# Patient Record
Sex: Male | Born: 1965 | Race: Black or African American | Hispanic: No | Marital: Single | Smoking: Former smoker
Health system: Southern US, Community
[De-identification: ages and names within clinical notes are randomized; demographics above are authoritative.]

## PROBLEM LIST (undated history)

## (undated) ENCOUNTER — Ambulatory Visit (HOSPITAL_COMMUNITY): Admission: EM | Payer: Self-pay

## (undated) DIAGNOSIS — E785 Hyperlipidemia, unspecified: Secondary | ICD-10-CM

## (undated) DIAGNOSIS — I739 Peripheral vascular disease, unspecified: Secondary | ICD-10-CM

## (undated) DIAGNOSIS — I10 Essential (primary) hypertension: Secondary | ICD-10-CM

## (undated) DIAGNOSIS — M1712 Unilateral primary osteoarthritis, left knee: Secondary | ICD-10-CM

## (undated) HISTORY — DX: Unilateral primary osteoarthritis, left knee: M17.12

## (undated) HISTORY — DX: Essential (primary) hypertension: I10

## (undated) HISTORY — DX: Peripheral vascular disease, unspecified: I73.9

## (undated) HISTORY — DX: Hyperlipidemia, unspecified: E78.5

---

## 1999-07-10 ENCOUNTER — Encounter: Payer: Self-pay | Admitting: Emergency Medicine

## 1999-07-10 ENCOUNTER — Emergency Department (HOSPITAL_COMMUNITY): Admission: EM | Admit: 1999-07-10 | Discharge: 1999-07-10 | Payer: Self-pay | Admitting: Emergency Medicine

## 2000-07-05 ENCOUNTER — Emergency Department (HOSPITAL_COMMUNITY): Admission: EM | Admit: 2000-07-05 | Discharge: 2000-07-05 | Payer: Self-pay | Admitting: Internal Medicine

## 2000-12-26 ENCOUNTER — Emergency Department (HOSPITAL_COMMUNITY): Admission: EM | Admit: 2000-12-26 | Discharge: 2000-12-26 | Payer: Self-pay | Admitting: Emergency Medicine

## 2002-06-12 ENCOUNTER — Emergency Department (HOSPITAL_COMMUNITY): Admission: EM | Admit: 2002-06-12 | Discharge: 2002-06-12 | Payer: Self-pay | Admitting: Emergency Medicine

## 2003-01-24 ENCOUNTER — Emergency Department (HOSPITAL_COMMUNITY): Admission: EM | Admit: 2003-01-24 | Discharge: 2003-01-24 | Payer: Self-pay | Admitting: Emergency Medicine

## 2003-02-06 ENCOUNTER — Emergency Department (HOSPITAL_COMMUNITY): Admission: EM | Admit: 2003-02-06 | Discharge: 2003-02-06 | Payer: Self-pay | Admitting: Emergency Medicine

## 2003-02-25 ENCOUNTER — Emergency Department (HOSPITAL_COMMUNITY): Admission: EM | Admit: 2003-02-25 | Discharge: 2003-02-25 | Payer: Self-pay | Admitting: Emergency Medicine

## 2007-10-19 ENCOUNTER — Emergency Department (HOSPITAL_COMMUNITY): Admission: EM | Admit: 2007-10-19 | Discharge: 2007-10-19 | Payer: Self-pay | Admitting: Emergency Medicine

## 2008-11-02 ENCOUNTER — Emergency Department (HOSPITAL_COMMUNITY): Admission: EM | Admit: 2008-11-02 | Discharge: 2008-11-02 | Payer: Self-pay | Admitting: Emergency Medicine

## 2009-06-23 ENCOUNTER — Emergency Department (HOSPITAL_COMMUNITY): Admission: EM | Admit: 2009-06-23 | Discharge: 2009-06-23 | Payer: Self-pay | Admitting: Emergency Medicine

## 2009-06-25 ENCOUNTER — Emergency Department (HOSPITAL_COMMUNITY): Admission: EM | Admit: 2009-06-25 | Discharge: 2009-06-26 | Payer: Self-pay | Admitting: Emergency Medicine

## 2009-08-10 ENCOUNTER — Emergency Department (HOSPITAL_COMMUNITY): Admission: EM | Admit: 2009-08-10 | Discharge: 2009-08-10 | Payer: Self-pay | Admitting: Emergency Medicine

## 2009-10-28 ENCOUNTER — Emergency Department (HOSPITAL_COMMUNITY): Admission: EM | Admit: 2009-10-28 | Discharge: 2009-10-28 | Payer: Self-pay | Admitting: Emergency Medicine

## 2010-12-05 LAB — DIFFERENTIAL
Basophils Absolute: 0 10*3/uL (ref 0.0–0.1)
Basophils Relative: 0 % (ref 0–1)
Basophils Relative: 0 % (ref 0–1)
Eosinophils Absolute: 0.1 10*3/uL (ref 0.0–0.7)
Eosinophils Absolute: 0.1 10*3/uL (ref 0.0–0.7)
Eosinophils Relative: 2 % (ref 0–5)
Lymphocytes Relative: 23 % (ref 12–46)
Lymphs Abs: 1.3 10*3/uL (ref 0.7–4.0)
Lymphs Abs: 1.9 10*3/uL (ref 0.7–4.0)
Monocytes Absolute: 0.4 10*3/uL (ref 0.1–1.0)
Monocytes Absolute: 0.6 10*3/uL (ref 0.1–1.0)
Monocytes Relative: 8 % (ref 3–12)
Monocytes Relative: 8 % (ref 3–12)
Neutro Abs: 3.6 10*3/uL (ref 1.7–7.7)
Neutrophils Relative %: 65 % (ref 43–77)
Neutrophils Relative %: 66 % (ref 43–77)

## 2010-12-05 LAB — CBC
HCT: 41.4 % (ref 39.0–52.0)
Hemoglobin: 13.7 g/dL (ref 13.0–17.0)
Hemoglobin: 13.8 g/dL (ref 13.0–17.0)
MCHC: 33.4 g/dL (ref 30.0–36.0)
MCHC: 33.5 g/dL (ref 30.0–36.0)
MCV: 85 fL (ref 78.0–100.0)
MCV: 85 fL (ref 78.0–100.0)
Platelets: 207 10*3/uL (ref 150–400)
RBC: 4.8 MIL/uL (ref 4.22–5.81)
RBC: 4.87 MIL/uL (ref 4.22–5.81)
RDW: 14.1 % (ref 11.5–15.5)
WBC: 5.4 10*3/uL (ref 4.0–10.5)
WBC: 7.6 10*3/uL (ref 4.0–10.5)

## 2010-12-05 LAB — POCT I-STAT, CHEM 8
BUN: 22 mg/dL (ref 6–23)
Chloride: 108 mEq/L (ref 96–112)
Creatinine, Ser: 0.9 mg/dL (ref 0.4–1.5)
Potassium: 4.4 mEq/L (ref 3.5–5.1)
Sodium: 142 mEq/L (ref 135–145)
TCO2: 25 mmol/L (ref 0–100)

## 2012-01-04 ENCOUNTER — Ambulatory Visit (INDEPENDENT_AMBULATORY_CARE_PROVIDER_SITE_OTHER): Payer: BC Managed Care – PPO | Admitting: Physician Assistant

## 2012-01-04 VITALS — BP 137/86 | HR 85 | Temp 98.4°F | Resp 18 | Ht 69.0 in | Wt 358.0 lb

## 2012-01-04 DIAGNOSIS — Z1211 Encounter for screening for malignant neoplasm of colon: Secondary | ICD-10-CM

## 2012-01-04 DIAGNOSIS — Z0000 Encounter for general adult medical examination without abnormal findings: Secondary | ICD-10-CM

## 2012-01-04 DIAGNOSIS — R5383 Other fatigue: Secondary | ICD-10-CM

## 2012-01-04 DIAGNOSIS — M25569 Pain in unspecified knee: Secondary | ICD-10-CM

## 2012-01-04 DIAGNOSIS — Z125 Encounter for screening for malignant neoplasm of prostate: Secondary | ICD-10-CM

## 2012-01-04 LAB — POCT URINALYSIS DIPSTICK
Glucose, UA: NEGATIVE
Leukocytes, UA: NEGATIVE
Nitrite, UA: NEGATIVE
Protein, UA: NEGATIVE
Urobilinogen, UA: 0.2

## 2012-01-04 LAB — POCT CBC
Lymph, poc: 2.5 (ref 0.6–3.4)
MCH, POC: 27.4 pg (ref 27–31.2)
MCHC: 31.6 g/dL — AB (ref 31.8–35.4)
MID (cbc): 0.3 (ref 0–0.9)
MPV: 9.7 fL (ref 0–99.8)
POC Granulocyte: 2.8 (ref 2–6.9)
POC MID %: 6 %M (ref 0–12)
Platelet Count, POC: 276 10*3/uL (ref 142–424)
RBC: 5 M/uL (ref 4.69–6.13)
WBC: 5.6 10*3/uL (ref 4.6–10.2)

## 2012-01-04 LAB — COMPREHENSIVE METABOLIC PANEL
ALT: 29 U/L (ref 0–53)
AST: 18 U/L (ref 0–37)
Alkaline Phosphatase: 88 U/L (ref 39–117)
Glucose, Bld: 99 mg/dL (ref 70–99)
Potassium: 4.3 mEq/L (ref 3.5–5.3)
Sodium: 138 mEq/L (ref 135–145)
Total Bilirubin: 0.4 mg/dL (ref 0.3–1.2)
Total Protein: 7.5 g/dL (ref 6.0–8.3)

## 2012-01-04 LAB — TESTOSTERONE: Testosterone: 347.27 ng/dL (ref 300–890)

## 2012-01-04 LAB — POCT UA - MICROSCOPIC ONLY
Casts, Ur, LPF, POC: NEGATIVE
Crystals, Ur, HPF, POC: NEGATIVE
Yeast, UA: NEGATIVE

## 2012-01-04 LAB — LIPID PANEL
LDL Cholesterol: 164 mg/dL — ABNORMAL HIGH (ref 0–99)
Triglycerides: 99 mg/dL (ref <150)
VLDL: 20 mg/dL (ref 0–40)

## 2012-01-04 LAB — TSH: TSH: 0.458 u[IU]/mL (ref 0.350–4.500)

## 2012-01-04 LAB — GLUCOSE, POCT (MANUAL RESULT ENTRY): POC Glucose: 99

## 2012-01-04 NOTE — Patient Instructions (Signed)

## 2012-01-04 NOTE — Progress Notes (Signed)
Subjective:    Patient ID: Mitchell Johnson, male    DOB: June 29, 1966, 46 y.o.   MRN: [id number]  HPI  Presents for DOT exam, needs CPE.    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  Since I saw him last, he has remarried (his first wife died in 2005-02-03).  He has cut back on his cigars, dwon from 6/day to 2.  Has lost about 10 lbs., with a goal weight of 250 lbs.  Tetanus is current.  Review of Systems  Constitutional: Positive for fatigue.  HENT: Negative.   Eyes: Negative.   Respiratory: Negative.   Cardiovascular: Negative.   Gastrointestinal: Negative.   Genitourinary: Negative.        Decrease sexual indurence.  Musculoskeletal: Positive for arthralgias (right lateral knee pain).  Skin: Negative.   Neurological: Negative.   Hematological: Negative.   Psychiatric/Behavioral: Negative.        Objective:   Physical Exam  Vitals reviewed. Constitutional: He is oriented to person, place, and time. Vital signs are normal. He appears well-developed and well-nourished.  Non-toxic appearance. He does not have a sickly appearance. He does not appear ill. No distress.  HENT:  Head: Normocephalic and atraumatic. No trismus in the jaw.  Right Ear: Hearing, tympanic membrane, external ear and ear canal normal.  Left Ear: Hearing, tympanic membrane, external ear and ear canal normal.  Nose: Nose normal.  Mouth/Throat: Uvula is midline, oropharynx is clear and moist and mucous membranes are normal. He does not have dentures. No oral lesions. Normal dentition. No dental abscesses, uvula swelling, lacerations or dental caries.  Eyes: Conjunctivae and EOM are normal. Pupils are equal, round, and reactive to light. Right eye exhibits no discharge. Left eye exhibits no discharge. No scleral icterus.  Fundoscopic exam:      The right eye shows no arteriolar narrowing, no AV nicking, no exudate, no hemorrhage and no papilledema. The right eye shows  red reflex.The right eye shows no venous pulsations.      The left eye shows no arteriolar narrowing, no AV nicking, no exudate, no hemorrhage and no papilledema. The left eye shows red reflex.The left eye shows no venous pulsations. Neck: Normal range of motion, full passive range of motion without pain and phonation normal. Neck supple. No spinous process tenderness and no muscular tenderness present. No rigidity. No tracheal deviation, no edema, no erythema and normal range of motion present. No thyromegaly present.  Cardiovascular: Normal rate, regular rhythm, S1 normal, S2 normal, normal heart sounds, intact distal pulses and normal pulses.  Exam reveals no gallop and no friction rub.   No murmur heard. Pulmonary/Chest: Effort normal and breath sounds normal. No respiratory distress. He has no wheezes. He has no rales.  Abdominal: Soft. Normal appearance and bowel sounds are normal. He exhibits no distension and no mass. There is no hepatosplenomegaly. There is no tenderness. There is no rebound and no guarding. No hernia. Hernia confirmed negative in the right inguinal area and confirmed negative in the left inguinal area.  Genitourinary: Rectum normal, prostate normal, testes normal and penis normal. Guaiac negative stool. No phimosis, paraphimosis, hypospadias, penile erythema or penile tenderness. No discharge found.  Musculoskeletal: Normal range of motion. He exhibits no edema and no tenderness.       Right shoulder: Normal.       Left shoulder: Normal.       Right elbow: Normal.  Left elbow: Normal.       Right wrist: Normal.       Left wrist: Normal.       Right hip: Normal.       Left hip: Normal.       Right knee: Normal.       Left knee: Normal.       Right ankle: Normal. Achilles tendon normal.       Left ankle: Normal. Achilles tendon normal.       Cervical back: Normal. He exhibits normal range of motion, no tenderness, no bony tenderness, no swelling, no edema, no  deformity, no laceration, no pain, no spasm and normal pulse.       Thoracic back: Normal.       Lumbar back: Normal.       Right upper arm: Normal.       Left upper arm: Normal.       Right forearm: Normal.       Left forearm: Normal.       Right hand: Normal.       Left hand: Normal.       Right upper leg: Normal.       Left upper leg: Normal.       Right lower leg: Normal.       Left lower leg: Normal.       Right foot: Normal.       Left foot: Normal.  Lymphadenopathy:       Head (right side): No submental, no submandibular, no tonsillar, no preauricular, no posterior auricular and no occipital adenopathy present.       Head (left side): No submental, no submandibular, no tonsillar, no preauricular, no posterior auricular and no occipital adenopathy present.    He has no cervical adenopathy.       Right: No inguinal and no supraclavicular adenopathy present.       Left: No inguinal and no supraclavicular adenopathy present.  Neurological: He is alert and oriented to person, place, and time. He has normal strength and normal reflexes. He displays no tremor. No cranial nerve deficit. He exhibits normal muscle tone. Coordination and gait normal. GCS eye subscore is 4. GCS verbal subscore is 5. GCS motor subscore is 6.  Skin: Skin is warm, dry and intact. No abrasion, no ecchymosis, no laceration, no lesion and no rash noted. He is not diaphoretic. No cyanosis or erythema. No pallor. Nails show no clubbing.  Psychiatric: He has a normal mood and affect. His speech is normal and behavior is normal. Judgment and thought content normal. Cognition and memory are normal.   Results for orders placed in visit on 01/04/12  IFOBT (OCCULT BLOOD)      Component Value Range   IFOBT Negative    POCT CBC      Component Value Range   WBC 5.6  4.6 - 10.2 (K/uL)   Lymph, poc 2.5  0.6 - 3.4    POC LYMPH PERCENT 44.6  10 - 50 (%L)   MID (cbc) 0.3  0 - 0.9    POC MID % 6.0  0 - 12 (%M)   POC  Granulocyte 2.8  2 - 6.9    Granulocyte percent 49.4  37 - 80 (%G)   RBC 5.00  4.69 - 6.13 (M/uL)   Hemoglobin 13.7 (*) 14.1 - 18.1 (g/dL)   HCT, POC 47.8 (*) 29.5 - 53.7 (%)   MCV 86.9  80 - 97 (fL)   MCH,  POC 27.4  27 - 31.2 (pg)   MCHC 31.6 (*) 31.8 - 35.4 (g/dL)   RDW, POC 45.4     Platelet Count, POC 276  142 - 424 (K/uL)   MPV 9.7  0 - 99.8 (fL)  GLUCOSE, POCT (MANUAL RESULT ENTRY)      Component Value Range   POC Glucose 99    POCT UA - MICROSCOPIC ONLY      Component Value Range   WBC, Ur, HPF, POC 0-1     RBC, urine, microscopic 0-2     Bacteria, U Microscopic trace     Mucus, UA neg     Epithelial cells, urine per micros 0-2     Crystals, Ur, HPF, POC neg     Casts, Ur, LPF, POC neg     Yeast, UA neg    POCT URINALYSIS DIPSTICK      Component Value Range   Color, UA yellow     Clarity, UA clear     Glucose, UA neg     Bilirubin, UA neg     Ketones, UA neg     Spec Grav, UA 1.025     Blood, UA trace     pH, UA 5.5     Protein, UA neg     Urobilinogen, UA 0.2     Nitrite, UA neg     Leukocytes, UA Negative        Assessment & Plan:   1. Routine general medical examination at a health care facility  POCT glucose (manual entry), POCT UA - Microscopic Only, POCT urinalysis dipstick, Comprehensive metabolic panel, Lipid panel  2. Morbid obesity  TSH, Testosterone  3. Fatigue  POCT CBC, Testosterone  4. Knee pain  NSAIDS prn  5. Screening for colon cancer  IFOBT POC (occult bld, rslt in office)  6. 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 Continue efforts for weight loss! Re-evaluate in 1 year.

## 2012-01-05 ENCOUNTER — Encounter: Payer: Self-pay | Admitting: Physician Assistant

## 2012-03-03 ENCOUNTER — Telehealth: Payer: Self-pay

## 2012-03-03 NOTE — Telephone Encounter (Signed)
Faxed over the requested information from message below to the patient at the fax number provided.

## 2012-03-03 NOTE — Telephone Encounter (Signed)
Pt needs his Long form to be faxed to [phone] Also, needs a copy for personal use. His license has been suppended - PLEASE SEND NOW.

## 2014-01-08 ENCOUNTER — Ambulatory Visit: Payer: Self-pay | Admitting: Family Medicine

## 2014-01-08 VITALS — BP 132/82 | HR 92 | Temp 98.0°F | Resp 18 | Ht 68.0 in | Wt 385.2 lb

## 2014-01-08 DIAGNOSIS — Z0289 Encounter for other administrative examinations: Secondary | ICD-10-CM

## 2014-01-08 NOTE — Progress Notes (Signed)
Commercial Driver Medical Examination   Mitchell Johnson L Crossen is a 48 y.o. male who presents today for a commercial driver fitness determination physical exam. The patient reports no problems.  Has been seen here by Tinnie GensJeffrey, PA prev for prior DOTs and occ full CPE. The following portions of the patient's history were reviewed and updated as appropriate: allergies, current medications, past family history, past medical history, past social history, past surgical history and problem list. Review of Systems A comprehensive review of systems was negative.   Objective:    Vision:  Uncorrected Corrected Horizontal Field of Vision  Right Eye 20/25 na 85 degrees  Left Eye  20/25 na 85 degrees  Both Eyes  20/20 na    Applicant can recognize and distinguish among traffic control signals and devices showing standard red, green, and amber colors.     Monocular Vision?: No   Hearing:   500 Hz 1000 Hz 2000 Hz 4000 Hz  Right Ear  na na na na  Left Ear  na na na na  Can hear whisper at 10 feet bilaterally.    BP 132/82  Pulse 92  Temp(Src) 98 F (36.7 C) (Oral)  Resp 18  Ht 5\' 8"  (1.727 m)  Wt 385 lb 3.2 oz (174.726 kg)  BMI 58.58 kg/m2  SpO2 100%  General Appearance:    Alert, cooperative, no distress, appears stated age  Head:    Normocephalic, without obvious abnormality, atraumatic  Eyes:    PERRL, conjunctiva/corneas clear, EOM's intact, fundi    benign, both eyes       Ears:    Normal TM's and external ear canals, both ears  Nose:   Nares normal, septum midline, mucosa normal, no drainage    or sinus tenderness  Throat:   Lips, mucosa, and tongue normal; teeth and gums normal  Neck:   Supple, symmetrical, trachea midline, no adenopathy;       thyroid:  No enlargement/tenderness/nodules; no carotid   bruit or JVD  Back:     Symmetric, no curvature, ROM normal, no CVA tenderness  Lungs:     Clear to auscultation bilaterally, respirations unlabored  Chest wall:    No tenderness or  deformity  Heart:    Regular rate and rhythm, S1 and S2 normal, no murmur, rub   or gallop  Abdomen:     Soft, non-tender, bowel sounds active all four quadrants,    no masses, no organomegaly  Genitalia:    Normal male without lesion, discharge or tenderness     Extremities:   Extremities normal, atraumatic, no cyanosis or edema  Pulses:   2+ and symmetric all extremities  Skin:   Skin color, texture, turgor normal, no rashes or lesions  Lymph nodes:   Cervical, supraclavicular, and axillary nodes normal  Neurologic:   CNII-XII intact. Normal strength, sensation and reflexes      throughout    Labs: Lab Results  Component Value Date   SPECGRAV 1.025 01/04/2012   PROTEINUR neg 01/04/2012   BILIRUBINUR neg 01/04/2012   SG 1.020, neg prot, trc blood, neg sugar    Assessment:    Healthy male exam.  Meets standards in 6149 CFR 391.41;  qualifies for 2 year certificate.    Plan:    Medical examiners certificate completed and printed. Return as needed.

## 2014-01-08 NOTE — Progress Notes (Signed)
   Subjective:    Patient ID: Mitchell Johnson, male    DOB: 10/27/1965, 48 y.o.   MRN: [id number] This chart was scribed for Levell Julyva N. Clelia CroftShaw, MD by Marica OtterNusrat Rahman, ED Scribe. This patient was seen in room 1 and the patient's care was started at 10:15 AM.    HPI  HPI Comments: Mitchell Johnson is a 48 y.o. male who presents to the [facility] accompanied by his wife complaining of elevated blood pressure. Pt states he has not taken meds for BP prior. Pt further reports that he has been watching his diet and exercising (including dancing) to lose weight. Pt reports he currently does not have health insurance but plans to rectify this situation soon.   PCP: Tinnie GensJeffrey   Past Medical History  Diagnosis Date  . Hyperlipidemia    No current outpatient prescriptions on file prior to visit.   No current facility-administered medications on file prior to visit.   No Known Allergies   Review of Systems  Constitutional: Negative for activity change, appetite change and unexpected weight change.  Respiratory: Negative for apnea, chest tightness, shortness of breath and wheezing.   Cardiovascular: Negative for chest pain, palpitations and leg swelling.  Neurological: Negative for syncope and weakness.    Objective:   Physical Exam  Nursing note and vitals reviewed. Constitutional: He is oriented to person, place, and time. He appears well-developed and well-nourished. No distress.  HENT:  Head: Normocephalic and atraumatic.  Eyes: EOM are normal.  Neck: Neck supple. No tracheal deviation present.  Cardiovascular: Normal rate.   Exam limited due to body habitus.   Pulmonary/Chest: Effort normal. No respiratory distress.  Musculoskeletal: Normal range of motion.  Neurological: He is alert and oriented to person, place, and time.  Skin: Skin is warm and dry.  Psychiatric: He has a normal mood and affect. His behavior is normal.   BP 132/82  Pulse 92  Temp(Src) 98 F (36.7 C)  (Oral)  Resp 18  Ht 5\' 8"  (1.727 m)  Wt 385 lb 3.2 oz (174.726 kg)  BMI 58.58 kg/m2  SpO2 100%   Assessment & Plan:  10:25 AM- No diagnosis found.  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  Initially pt's BP was quite elevated so discussed possibility of anti-HTN meds however BP improved when checked w/ thigh cuff. Pt does not currently have health ins but plans to obtain then will RTC for full CPE inc bp check, lipids, DM screen. Reviewed diet/exercise/weight loss w/ pt.   I personally performed the services described in this documentation, which was scribed in my presence. The recorded information has been reviewed and considered, and addended by me as needed.  Norberto SorensonEva Yeshua Stryker, MD MPH

## 2014-01-08 NOTE — Progress Notes (Deleted)
° °  Subjective:    Patient ID: Mitchell Johnson, male    DOB: 10/28/1965, 48 y.o.   MRN: [id number] This chart was scribed for Levell Julyva N. Clelia CroftShaw, MD by Marica OtterNusrat Rahman, ED Scribe. This patient was seen in room 1 and the patient's care was started at 10:15 AM.    HPI  HPI Comments: Mitchell Johnson is a 48 y.o. male who presents to the [facility] accompanied by his wifecomplaining of elevated blood pressure. Pt states he has not taken meds for his HTN prior. Pt further reports that he has been watching his diet and exercising (including dancing) to lose weight. Pt reports he currently does not have health insurance but plans to rectify this situation soon. Pt denies chest pain, SOB, palpitations, orthopnea, or changes in edema.   PCP: Tinnie GensJeffrey   Review of Systems  Respiratory: Negative for shortness of breath.   Cardiovascular: Negative for palpitations.    Objective:   Physical Exam  Nursing note and vitals reviewed. Constitutional: He is oriented to person, place, and time. He appears well-developed and well-nourished. No distress.  HENT:  Head: Normocephalic and atraumatic.  Eyes: EOM are normal.  Neck: Neck supple. No tracheal deviation present.  Cardiovascular: Normal rate.   Exam limited due to body habitus.   Pulmonary/Chest: Effort normal. No respiratory distress.  Musculoskeletal: Normal range of motion.  Neurological: He is alert and oriented to person, place, and time.  Skin: Skin is warm and dry.  Psychiatric: He has a normal mood and affect. His behavior is normal.   BP 132/82   Pulse 92   Temp(Src) 98 F (36.7 C) (Oral)   Resp 18   Ht 5\' 8"  (1.727 m)   Wt 385 lb 3.2 oz (174.726 kg)   BMI 58.58 kg/m2   SpO2 100%   Assessment & Plan:  10:25 AM-Discussed treatment plan which includes starting anti HTN meds today and lab work with pt at bedside. Pt declines lab work due to expense, however, will return to the clinic when he has insurance for lab work including  testing for DM. Pt agrees to remainder of plan.  No diagnosis found.  No orders of the defined types were placed in this encounter.    I personally performed the services described in this documentation, which was scribed in my presence. The recorded information has been reviewed and considered, and addended by me as needed.  Norberto SorensonEva Shaw, MD MPH

## 2014-01-21 ENCOUNTER — Encounter (HOSPITAL_COMMUNITY): Payer: Self-pay | Admitting: Emergency Medicine

## 2014-01-21 ENCOUNTER — Emergency Department (HOSPITAL_COMMUNITY)
Admission: EM | Admit: 2014-01-21 | Discharge: 2014-01-21 | Disposition: A | Discharge status: Home / Self Care | Payer: Self-pay | Attending: Emergency Medicine | Admitting: Emergency Medicine

## 2014-01-21 ENCOUNTER — Emergency Department (HOSPITAL_COMMUNITY): Payer: Self-pay

## 2014-01-21 DIAGNOSIS — X500XXA Overexertion from strenuous movement or load, initial encounter: Secondary | ICD-10-CM | POA: Insufficient documentation

## 2014-01-21 DIAGNOSIS — Y9301 Activity, walking, marching and hiking: Secondary | ICD-10-CM | POA: Insufficient documentation

## 2014-01-21 DIAGNOSIS — F17200 Nicotine dependence, unspecified, uncomplicated: Secondary | ICD-10-CM | POA: Insufficient documentation

## 2014-01-21 DIAGNOSIS — IMO0002 Reserved for concepts with insufficient information to code with codable children: Secondary | ICD-10-CM | POA: Insufficient documentation

## 2014-01-21 DIAGNOSIS — Z862 Personal history of diseases of the blood and blood-forming organs and certain disorders involving the immune mechanism: Secondary | ICD-10-CM | POA: Insufficient documentation

## 2014-01-21 DIAGNOSIS — Y929 Unspecified place or not applicable: Secondary | ICD-10-CM | POA: Insufficient documentation

## 2014-01-21 DIAGNOSIS — Z8639 Personal history of other endocrine, nutritional and metabolic disease: Secondary | ICD-10-CM | POA: Insufficient documentation

## 2014-01-21 DIAGNOSIS — S8390XA Sprain of unspecified site of unspecified knee, initial encounter: Secondary | ICD-10-CM

## 2014-01-21 MED ORDER — NAPROXEN 500 MG PO TABS: 500.0000 mg | ORAL_TABLET | Freq: Two times a day (BID) | ORAL | Status: DC

## 2014-01-21 MED ORDER — OXYCODONE-ACETAMINOPHEN 5-325 MG PO TABS
1.0000 | ORAL_TABLET | Freq: Once | ORAL | Status: AC
  Administered 2014-01-21: 1 via ORAL
  Filled 2014-01-21: qty 1

## 2014-01-21 MED ORDER — OXYCODONE-ACETAMINOPHEN 5-325 MG PO TABS
1.0000 | ORAL_TABLET | Freq: Four times a day (QID) | ORAL | Status: DC | PRN
Start: 2014-01-21 — End: 2014-12-30

## 2014-01-21 NOTE — Discharge Instructions (Signed)
Follow up with Dr. Ophelia Charter this week for recheck

## 2014-01-21 NOTE — ED Provider Notes (Signed)
CSN: [id number]     Arrival date & time 01/21/14  2034 History   First MD Initiated Contact with Patient 01/21/14 2129     Chief Complaint  Patient presents with  . Knee Injury     (Consider location/radiation/quality/duration/timing/severity/associated sxs/prior Treatment) Patient is a 48 y.o. male presenting with knee pain. The history is provided by the patient (the pt twisted his knee today and has pain in the right knee medially).  Knee Pain Lower extremity pain location: medial right knee. Injury: yes   Mechanism of injury comment:  Twisted Pain details:    Quality:  Aching   Radiates to:  Does not radiate   Severity:  Moderate   Onset quality:  Gradual   Timing:  Constant   Progression:  Unchanged Associated symptoms: no back pain and no fatigue     Past Medical History  Diagnosis Date  . Hyperlipidemia    History reviewed. No pertinent past surgical history. Family History  Problem Relation Age of Onset  . Diabetes Mother   . Hypertension Mother   . Diabetes Father    History  Substance Use Topics  . Smoking status: Light Tobacco Smoker -- 0.30 packs/day    Types: Cigars  . Smokeless tobacco: Never Used  . Alcohol Use: No    Review of Systems  Constitutional: Negative for appetite change and fatigue.  HENT: Negative for congestion, ear discharge and sinus pressure.   Eyes: Negative for discharge.  Respiratory: Negative for cough.   Cardiovascular: Negative for chest pain.  Gastrointestinal: Negative for abdominal pain and diarrhea.  Genitourinary: Negative for frequency and hematuria.  Musculoskeletal: Negative for back pain.       Right knee pain  Skin: Negative for rash.  Neurological: Negative for seizures and headaches.  Psychiatric/Behavioral: Negative for hallucinations.      Allergies  Review of patient's allergies indicates no known allergies.  Home Medications   Prior to Admission medications   Medication Sig Start Date End Date  Taking? Authorizing Provider  naproxen (NAPROSYN) 500 MG tablet Take 1 tablet (500 mg total) by mouth 2 (two) times daily. 01/21/14   Benny LennertJoseph L Hunner Garcon, MD  oxyCODONE-acetaminophen (PERCOCET/ROXICET) 5-325 MG per tablet Take 1 tablet by mouth every 6 (six) hours as needed for severe pain. 01/21/14   Benny LennertJoseph L Soren Lazarz, MD   BP 148/84  Pulse 93  Temp(Src) 97.7 F (36.5 C) (Oral)  Resp 18  SpO2 98% Physical Exam  Constitutional: He is oriented to person, place, and time. He appears well-developed.  HENT:  Head: Normocephalic.  Eyes: Conjunctivae are normal.  Neck: No tracheal deviation present.  Cardiovascular:  No murmur heard. Musculoskeletal: Normal range of motion.  Tender medial right knee  Neurological: He is oriented to person, place, and time.  Skin: Skin is warm.  Psychiatric: He has a normal mood and affect.    ED Course  Procedures (including critical care time) Labs Review Labs Reviewed - No data to display  Imaging Review Dg Knee Complete 4 Views Right  01/21/2014   CLINICAL DATA:  Knee pain while walking.  EXAM: RIGHT KNEE - COMPLETE 4+ VIEW  COMPARISON:  None.  FINDINGS: Normal anatomic alignment. No evidence for acute fracture or dislocation. Regional soft tissues are unremarkable.  IMPRESSION: No evidence for acute fracture or dislocation.   Electronically Signed   By: Annia Beltrew  Davis M.D.   On: 01/21/2014 22:18     EKG Interpretation None      MDM   Final  diagnoses:  Knee sprain        Benny Lennert, MD 01/21/14 2243

## 2014-01-21 NOTE — ED Notes (Signed)
Patient is alert and oriented x3.  He is complaining of right knee pain that started yesterday afternoon After he heard a pop in his knee while he was getting out of his truck.  Currently he denies while sitting. When walking he rates his pain 8 of 10.

## 2014-12-30 ENCOUNTER — Emergency Department (HOSPITAL_COMMUNITY): Payer: Self-pay

## 2014-12-30 ENCOUNTER — Encounter (HOSPITAL_COMMUNITY): Payer: Self-pay

## 2014-12-30 ENCOUNTER — Emergency Department (HOSPITAL_COMMUNITY)
Admission: EM | Admit: 2014-12-30 | Discharge: 2014-12-30 | Disposition: A | Discharge status: Home / Self Care | Payer: Self-pay | Attending: Emergency Medicine | Admitting: Emergency Medicine

## 2014-12-30 DIAGNOSIS — Z79899 Other long term (current) drug therapy: Secondary | ICD-10-CM | POA: Insufficient documentation

## 2014-12-30 DIAGNOSIS — M1712 Unilateral primary osteoarthritis, left knee: Secondary | ICD-10-CM

## 2014-12-30 DIAGNOSIS — Z8639 Personal history of other endocrine, nutritional and metabolic disease: Secondary | ICD-10-CM | POA: Insufficient documentation

## 2014-12-30 DIAGNOSIS — Z720 Tobacco use: Secondary | ICD-10-CM | POA: Insufficient documentation

## 2014-12-30 DIAGNOSIS — Z791 Long term (current) use of non-steroidal anti-inflammatories (NSAID): Secondary | ICD-10-CM | POA: Insufficient documentation

## 2014-12-30 DIAGNOSIS — L03116 Cellulitis of left lower limb: Secondary | ICD-10-CM | POA: Insufficient documentation

## 2014-12-30 DIAGNOSIS — L02416 Cutaneous abscess of left lower limb: Secondary | ICD-10-CM | POA: Insufficient documentation

## 2014-12-30 HISTORY — DX: Unilateral primary osteoarthritis, left knee: M17.12

## 2014-12-30 LAB — CBC WITH DIFFERENTIAL/PLATELET
BASOS ABS: 0.1 10*3/uL (ref 0.0–0.1)
BASOS PCT: 1 % (ref 0–1)
EOS ABS: 0.1 10*3/uL (ref 0.0–0.7)
Eosinophils Relative: 1 % (ref 0–5)
HEMATOCRIT: 43 % (ref 39.0–52.0)
HEMOGLOBIN: 14.2 g/dL (ref 13.0–17.0)
LYMPHS ABS: 2.4 10*3/uL (ref 0.7–4.0)
LYMPHS PCT: 22 % (ref 12–46)
MCH: 28.4 pg (ref 26.0–34.0)
MCHC: 33 g/dL (ref 30.0–36.0)
MCV: 86 fL (ref 78.0–100.0)
Monocytes Absolute: 0.7 10*3/uL (ref 0.1–1.0)
Monocytes Relative: 6 % (ref 3–12)
NEUTROS ABS: 7.8 10*3/uL — AB (ref 1.7–7.7)
Neutrophils Relative %: 70 % (ref 43–77)
Platelets: 234 10*3/uL (ref 150–400)
RBC: 5 MIL/uL (ref 4.22–5.81)
RDW: 14 % (ref 11.5–15.5)
WBC: 11.1 10*3/uL — ABNORMAL HIGH (ref 4.0–10.5)

## 2014-12-30 LAB — BASIC METABOLIC PANEL
ANION GAP: 5 (ref 5–15)
BUN: 10 mg/dL (ref 6–23)
CALCIUM: 8.7 mg/dL (ref 8.4–10.5)
CO2: 24 mmol/L (ref 19–32)
Chloride: 109 mmol/L (ref 96–112)
Creatinine, Ser: 1.08 mg/dL (ref 0.50–1.35)
GFR calc Af Amer: >90 mL/min (ref >90)
GFR, EST NON AFRICAN AMERICAN: 79 mL/min — AB (ref >90)
Glucose, Bld: 100 mg/dL — ABNORMAL HIGH (ref 70–99)
POTASSIUM: 4.1 mmol/L (ref 3.5–5.1)
SODIUM: 138 mmol/L (ref 135–145)

## 2014-12-30 MED ORDER — SULFAMETHOXAZOLE-TRIMETHOPRIM 800-160 MG PO TABS: 1.0000 | ORAL_TABLET | Freq: Two times a day (BID) | ORAL | Status: DC

## 2014-12-30 MED ORDER — HYDROMORPHONE HCL 1 MG/ML IJ SOLN
1.0000 mg | Freq: Once | INTRAMUSCULAR | Status: AC
  Administered 2014-12-30: 1 mg via INTRAVENOUS
  Filled 2014-12-30: qty 1

## 2014-12-30 MED ORDER — SULFAMETHOXAZOLE-TRIMETHOPRIM 800-160 MG PO TABS
1.0000 | ORAL_TABLET | Freq: Once | ORAL | Status: AC
  Administered 2014-12-30: 1 via ORAL
  Filled 2014-12-30: qty 1

## 2014-12-30 MED ORDER — CEPHALEXIN 500 MG PO CAPS: 500.0000 mg | ORAL_CAPSULE | Freq: Three times a day (TID) | ORAL | Status: DC

## 2014-12-30 MED ORDER — LIDOCAINE HCL 2 % IJ SOLN
INTRAMUSCULAR | Status: AC
  Filled 2014-12-30: qty 20

## 2014-12-30 MED ORDER — HYDROCODONE-ACETAMINOPHEN 5-325 MG PO TABS: 1.0000 | ORAL_TABLET | ORAL | Status: DC | PRN

## 2014-12-30 MED ORDER — SULFAMETHOXAZOLE-TRIMETHOPRIM 800-160 MG PO TABS
1.0000 | ORAL_TABLET | Freq: Two times a day (BID) | ORAL | Status: AC
Start: 2014-12-30 — End: 2015-01-06

## 2014-12-30 MED ORDER — OXYCODONE-ACETAMINOPHEN 5-325 MG PO TABS
2.0000 | ORAL_TABLET | Freq: Once | ORAL | Status: AC
  Administered 2014-12-30: 2 via ORAL
  Filled 2014-12-30: qty 2

## 2014-12-30 MED ORDER — CEPHALEXIN 500 MG PO CAPS
500.0000 mg | ORAL_CAPSULE | Freq: Once | ORAL | Status: AC
  Administered 2014-12-30: 500 mg via ORAL
  Filled 2014-12-30: qty 1

## 2014-12-30 MED ORDER — IOHEXOL 300 MG/ML  SOLN
100.0000 mL | Freq: Once | INTRAMUSCULAR | Status: AC | PRN
  Administered 2014-12-30: 100 mL via INTRAVENOUS

## 2014-12-30 NOTE — Discharge Instructions (Signed)

## 2014-12-30 NOTE — ED Notes (Signed)
Pt presents with c/o possible left leg cellulitis. Pt feels like he may have been bitten by a bug earlier today. Pt's leg is very swollen, warm to the touch, and he has a centralized area behind his left knee with pus drainage and a wound. Pt ambulatory to triage room.

## 2014-12-30 NOTE — ED Notes (Signed)
Pt to CT scan and returned without distress noted. 

## 2014-12-30 NOTE — ED Notes (Signed)
Attempted to venipuncture x1 unsuccessful.

## 2014-12-30 NOTE — ED Provider Notes (Signed)
CSN: [id number]     Arrival date & time 12/30/14  1625 History   First MD Initiated Contact with Patient 12/30/14 1659     Chief Complaint  Patient presents with  . Insect Bite      HPI Patient reports increasing swelling on his left leg with now developing redness, tenderness and small amount of pus drained.  He has had abscess formation in the past.  Never in this region.  He denies fevers or chills.  No trauma that he can remember.  No pain with range of motion of his left knee except at extremes.  Pain is worse with palpation of his popliteal space and proximal left calf.   Past Medical History  Diagnosis Date  . Hyperlipidemia    History reviewed. No pertinent past surgical history. Family History  Problem Relation Age of Onset  . Diabetes Mother   . Hypertension Mother   . Diabetes Father    History  Substance Use Topics  . Smoking status: Light Tobacco Smoker -- 0.30 packs/day    Types: Cigars  . Smokeless tobacco: Never Used  . Alcohol Use: No    Review of Systems  All other systems reviewed and are negative.     Allergies  Review of patient's allergies indicates no known allergies.  Home Medications   Prior to Admission medications   Medication Sig Start Date End Date Taking? Authorizing Provider  Pseudoephedrine-Ibuprofen 30-200 MG CAPS Take 1-2 capsules by mouth every 6 (six) hours as needed (cough/cold).   Yes Historical Provider, MD  naproxen (NAPROSYN) 500 MG tablet Take 1 tablet (500 mg total) by mouth 2 (two) times daily. Patient not taking: Reported on 12/30/2014 01/21/14   Bethann BerkshireJoseph Zammit, MD  oxyCODONE-acetaminophen (PERCOCET/ROXICET) 5-325 MG per tablet Take 1 tablet by mouth every 6 (six) hours as needed for severe pain. Patient not taking: Reported on 12/30/2014 01/21/14   Bethann BerkshireJoseph Zammit, MD   BP 158/84 mmHg  Pulse 88  Temp(Src) 97.8 F (36.6 C) (Oral)  Resp 18  SpO2 100% Physical Exam  Constitutional: He is oriented to person, place, and time.  He appears well-developed and well-nourished.  HENT:  Head: Normocephalic.  Eyes: EOM are normal.  Neck: Normal range of motion.  Pulmonary/Chest: Effort normal.  Abdominal: He exhibits no distension.  Musculoskeletal: Normal range of motion.  No joint effusion present.  Erythema warmth and induration of the left popliteal space but primarily the left proximal posterior calf.  There is a small amount of drainage from the proximal posterior medial aspect of the left calf.  Normal pulses in left foot.  Neurological: He is alert and oriented to person, place, and time.  Psychiatric: He has a normal mood and affect.  Nursing note and vitals reviewed.   ED Course  Procedures (including critical care time)  INCISION AND DRAINAGE Performed by: Lyanne CoAMPOS,Yaneisy Wenz M Consent: Verbal consent obtained. Risks and benefits: risks, benefits and alternatives were discussed Time out performed prior to procedure Type: abscess Body area: left popliteal space Anesthesia: local infiltration Incision was made with a scalpel. Local anesthetic: lidocaine 1% without epinephrine Anesthetic total: 7 ml Complexity: complex Blunt dissection to break up loculations Drainage: purulent Drainage amount: small Packing material: none Patient tolerance: Patient tolerated the procedure well with no immediate complications.    Labs Review Labs Reviewed  CBC WITH DIFFERENTIAL/PLATELET - Abnormal; Notable for the following:    WBC 11.1 (*)    Neutro Abs 7.8 (*)    All other components within  normal limits  BASIC METABOLIC PANEL - Abnormal; Notable for the following:    Glucose, Bld 100 (*)    GFR calc non Af Amer 79 (*)    All other components within normal limits    Imaging Review Ct Knee Left W Contrast  12/30/2014   CLINICAL DATA:  Possible left leg cellulitis, feels as though he may have been bitten by a bug earlier today, leg is very swollen and warm to touch with a centralized area posterior to the knee  with posterior pus drainage and a wound  EXAM: CT OF THE LEFT KNEE WITH CONTRAST  TECHNIQUE: Multidetector CT imaging was performed following the standard protocol during bolus administration of intravenous contrast.  COMPARISON:  Radiograph performed earlier today  FINDINGS: Just above the knee there is mild increased attenuation in the subcutaneous soft tissues primarily posteriorly. This becomes more prominent at and below the level of the knee were there is skin thickening posteriorly and moderate subcutaneous inflammatory change posteriorly. Below the knee inflammatory change extends around into the lateral and to a lesser degree medial and anterior soft tissues. There is no evidence of abscess. Vascular structures are intact. Osseous structures are intact. There is significant arthritic change, particularly medially were there is severe joint space narrowing and osteophyte formation. There is no periosteal reaction.  IMPRESSION: Findings consistent with extensive diffuse cellulitis.   Electronically Signed   By: Esperanza Heir M.D.   On: 12/30/2014 19:08  I personally reviewed the imaging tests through PACS system I reviewed available ER/hospitalization records through the EMR    EKG Interpretation None      MDM   Final diagnoses:  Abscess of left knee    CT scan performed to demonstrate depth of infection and any involvement of the arterial/venous structures of the popliteal space.  These appear to be clear.  No obvious abscess noted on CT scan however clinically the patient has abscess and therefore incision and drainage was performed.  A small amount of pus was obtained and this space was opened.  I was careful to not go too deep so as to not involve into the vascular structures of the popliteal space.  A primarily stayed on the posterior proximal calf.  Placed on antibiotics.  Patient understands return to the ER for new or worsening symptoms.  Afebrile.  Vital signs stable.  Patient is  not a diabetic.  Home on Bactrim and Keflex.    Azalia Bilis, MD 12/31/14 [phone]

## 2014-12-30 NOTE — ED Notes (Signed)
Awake. Verbally responsive. A/O x4. Resp even and unlabored. No audible adventitious breath sounds noted. ABC's intact. Noted open area to lt posterior knee with redness and swelling surrounding. (+)PMS, CRT brisk. LROM to lt leg.

## 2015-10-13 ENCOUNTER — Emergency Department (HOSPITAL_COMMUNITY)
Admission: EM | Admit: 2015-10-13 | Discharge: 2015-10-13 | Disposition: A | Discharge status: Home / Self Care | Payer: No Typology Code available for payment source | Attending: Emergency Medicine | Admitting: Emergency Medicine

## 2015-10-13 ENCOUNTER — Emergency Department (HOSPITAL_COMMUNITY): Payer: No Typology Code available for payment source

## 2015-10-13 ENCOUNTER — Encounter (HOSPITAL_COMMUNITY): Payer: Self-pay | Admitting: Emergency Medicine

## 2015-10-13 DIAGNOSIS — Y998 Other external cause status: Secondary | ICD-10-CM | POA: Diagnosis not present

## 2015-10-13 DIAGNOSIS — Y92410 Unspecified street and highway as the place of occurrence of the external cause: Secondary | ICD-10-CM | POA: Insufficient documentation

## 2015-10-13 DIAGNOSIS — M5136 Other intervertebral disc degeneration, lumbar region: Secondary | ICD-10-CM

## 2015-10-13 DIAGNOSIS — Z792 Long term (current) use of antibiotics: Secondary | ICD-10-CM | POA: Diagnosis not present

## 2015-10-13 DIAGNOSIS — Z23 Encounter for immunization: Secondary | ICD-10-CM | POA: Diagnosis not present

## 2015-10-13 DIAGNOSIS — S60222A Contusion of left hand, initial encounter: Secondary | ICD-10-CM | POA: Diagnosis not present

## 2015-10-13 DIAGNOSIS — S6992XA Unspecified injury of left wrist, hand and finger(s), initial encounter: Secondary | ICD-10-CM | POA: Diagnosis present

## 2015-10-13 DIAGNOSIS — S60512A Abrasion of left hand, initial encounter: Secondary | ICD-10-CM

## 2015-10-13 DIAGNOSIS — M545 Low back pain: Secondary | ICD-10-CM

## 2015-10-13 DIAGNOSIS — F17210 Nicotine dependence, cigarettes, uncomplicated: Secondary | ICD-10-CM | POA: Diagnosis not present

## 2015-10-13 DIAGNOSIS — Y9389 Activity, other specified: Secondary | ICD-10-CM | POA: Insufficient documentation

## 2015-10-13 DIAGNOSIS — S3992XA Unspecified injury of lower back, initial encounter: Secondary | ICD-10-CM | POA: Diagnosis not present

## 2015-10-13 DIAGNOSIS — Z8639 Personal history of other endocrine, nutritional and metabolic disease: Secondary | ICD-10-CM | POA: Insufficient documentation

## 2015-10-13 DIAGNOSIS — M79642 Pain in left hand: Secondary | ICD-10-CM

## 2015-10-13 DIAGNOSIS — S60312A Abrasion of left thumb, initial encounter: Secondary | ICD-10-CM | POA: Diagnosis not present

## 2015-10-13 DIAGNOSIS — S8992XA Unspecified injury of left lower leg, initial encounter: Secondary | ICD-10-CM | POA: Insufficient documentation

## 2015-10-13 HISTORY — DX: Other intervertebral disc degeneration, lumbar region: M51.36

## 2015-10-13 MED ORDER — TETANUS-DIPHTH-ACELL PERTUSSIS 5-2.5-18.5 LF-MCG/0.5 IM SUSP
0.5000 mL | Freq: Once | INTRAMUSCULAR | Status: AC
  Administered 2015-10-13: 0.5 mL via INTRAMUSCULAR
  Filled 2015-10-13: qty 0.5

## 2015-10-13 MED ORDER — ACETAMINOPHEN 325 MG PO TABS
650.0000 mg | ORAL_TABLET | Freq: Once | ORAL | Status: AC
  Administered 2015-10-13: 650 mg via ORAL
  Filled 2015-10-13: qty 2

## 2015-10-13 MED ORDER — IBUPROFEN 800 MG PO TABS
800.0000 mg | ORAL_TABLET | Freq: Once | ORAL | Status: AC
  Administered 2015-10-13: 800 mg via ORAL
  Filled 2015-10-13: qty 1
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
## 2015-10-13 NOTE — ED Notes (Signed)
Patient is alert and oriented x3.  He was given DC instructions and follow up visit instructions.  Patient gave verbal understanding.  He was DC ambulatory under his own power to home.  V/S stable.  He was not showing any signs of distress on DC 

## 2016-01-08 ENCOUNTER — Ambulatory Visit (INDEPENDENT_AMBULATORY_CARE_PROVIDER_SITE_OTHER): Payer: Self-pay | Admitting: Physician Assistant

## 2016-01-08 VITALS — BP 136/88 | HR 93 | Temp 98.4°F | Resp 16 | Ht 69.0 in | Wt 389.0 lb

## 2016-01-08 DIAGNOSIS — Z021 Encounter for pre-employment examination: Secondary | ICD-10-CM

## 2016-01-08 DIAGNOSIS — Z0289 Encounter for other administrative examinations: Secondary | ICD-10-CM

## 2016-01-08 NOTE — Patient Instructions (Signed)
     IF you received an x-ray today, you will receive an invoice from [facility]. Please contact  Radiology at [phone] with questions or concerns regarding your invoice.   IF you received labwork today, you will receive an invoice from [facility]. Please contact [facility] at [phone] with questions or concerns regarding your invoice.   Our billing staff will not be able to assist you with questions regarding bills from these companies.  You will be contacted with the lab results as soon as they are available. The fastest way to get your results is to activate your My Chart account. Instructions are located on the last page of this paperwork. If you have not heard from us regarding the results in 2 weeks, please contact this office.      

## 2016-01-08 NOTE — Progress Notes (Signed)
Commercial Driver Medical Examination   Mitchell Johnson is a 50 y.o. male who presents today for a commercial driver fitness determination physical exam. The patient reports no problems. The following portions of the patient's history were reviewed and updated as appropriate: allergies, current medications, past family history, past medical history, past social history, past surgical history and problem list.  No PMH other than severe obesity. No hx HTN, DM, OSA. No daytime somnolence. No tobacco use. No surghx. He was last seen for CPE 2013 with Porfirio Oar. Last labs in system 1 year ago with normal CBC and CMET.   Review of Systems Pertinent items are noted in HPI.   Objective:    Vision/hearing:  Visual Acuity Screening   Right eye Left eye Both eyes  Without correction:  With correction:     Hearing Screening Comments: Peripheral Vision: Right eye 85degrees. Left eye 85 degrees. The patient can distinguish the colors red, amber and green. The patient was able to hear a forced whisper from L=10, R=10 feet.  Applicant can recognize and distinguish among traffic control signals and devices showing standard red, green, and amber colors.  Corrective lenses required: No  Monocular Vision?: No  Hearing aid requirement: No  Physical Exam  Constitutional: He is oriented to person, place, and time. He appears well-developed and well-nourished.  HENT:  Head: Normocephalic and atraumatic.  Right Ear: Hearing, tympanic membrane, external ear and ear canal normal.  Left Ear: Hearing, tympanic membrane, external ear and ear canal normal.  Nose: Nose normal.  Mouth/Throat: Uvula is midline, oropharynx is clear and moist and mucous membranes are normal.  Eyes: Conjunctivae, EOM and lids are normal. Pupils are equal, round, and reactive to light. Right eye exhibits no discharge. Left eye exhibits no discharge. No scleral icterus.  Neck: Trachea normal and normal  range of motion. Neck supple. Carotid bruit is not present. No thyromegaly present.  Cardiovascular: Normal rate, regular rhythm, normal heart sounds, intact distal pulses and normal pulses.   No murmur heard. Pulmonary/Chest: Effort normal and breath sounds normal. No respiratory distress. He has no wheezes. He has no rhonchi. He has no rales.  Abdominal: Soft. Normal appearance. There is no tenderness.  obese  Musculoskeletal: Normal range of motion. He exhibits no edema or tenderness.  Lymphadenopathy:       Head (right side): No submental, no submandibular and no tonsillar adenopathy present.       Head (left side): No submental, no submandibular and no tonsillar adenopathy present.    He has no cervical adenopathy.  Neurological: He is alert and oriented to person, place, and time. He has normal strength and normal reflexes. No cranial nerve deficit. Coordination and gait normal.  Skin: Skin is warm, dry and intact. No lesion and no rash noted.  Psychiatric: He has a normal mood and affect. His speech is normal and behavior is normal. Judgment and thought content normal.    BP 136/88 mmHg  Pulse 93  Temp(Src) 98.4 F (36.9 C) (Oral)  Resp 16  Ht  (1.753 m)  Wt 389 lb (176.449 kg)  BMI 57.42 kg/m2  SpO2 98%  Labs: Comments: SPGR 1.025,pro:neg,Blood:trace,Glu:neg   Assessment:    Healthy male exam.  Meets standards in 59 CFR 391.41;  qualifies for 2 year certificate.    Plan:    Medical examiners certificate completed and printed. Return as needed.  Return for CPE soon - follow up on trace on blood on UA  at that time. We discussed weight loss
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
## 2017-12-07 ENCOUNTER — Encounter: Payer: Self-pay | Admitting: Physician Assistant

## 2018-03-06 ENCOUNTER — Other Ambulatory Visit: Payer: Self-pay

## 2018-03-06 ENCOUNTER — Ambulatory Visit (INDEPENDENT_AMBULATORY_CARE_PROVIDER_SITE_OTHER): Payer: Self-pay | Admitting: Physician Assistant

## 2018-03-06 ENCOUNTER — Encounter: Payer: Self-pay | Admitting: Physician Assistant

## 2018-03-06 VITALS — BP 134/88 | HR 89 | Temp 98.2°F | Resp 18 | Ht 69.0 in | Wt >= 6400 oz

## 2018-03-06 DIAGNOSIS — Z0289 Encounter for other administrative examinations: Secondary | ICD-10-CM

## 2018-03-06 NOTE — Patient Instructions (Addendum)
See you back in two years.    IF you received an x-ray today, you will receive an invoice from [facility]. Please contact [facility] at [phone] with questions or concerns regarding your invoice.   IF you received labwork today, you will receive an invoice from [facility]. Please contact [facility] at [phone] with questions or concerns regarding your invoice.   Our billing staff will not be able to assist you with questions regarding bills from these companies.  You will be contacted with the lab results as soon as they are available. The fastest way to get your results is to activate your My Chart account. Instructions are located on the last page of this paperwork. If you have not heard from us regarding the results in 2 weeks, please contact this office.

## 2018-03-06 NOTE — Progress Notes (Signed)
Commercial Driver Medical Examination   Mitchell Johnson is a 52 y.o. male with a pertinent medical history of obeisty who presents today for a commercial driver fitness determination physical exam. The patient reports no problems today. In the past the patient reports receiving 2  year certificates. He denies focal neurological deficits, vision and hearing changes. He denies the habitual use of benzodiazepines, opioids, amphetamines and denies illicit drug use.   Current medications, family history, allergies, social history reviewed by me and exist elsewhere in the encounter.   Review of Systems  Constitutional: Negative for chills, diaphoresis and fever.  HENT: Negative for hearing loss.   Eyes: Negative.  Negative for blurred vision, double vision, photophobia, pain, discharge and redness.  Respiratory: Negative for cough, hemoptysis, sputum production, shortness of breath and wheezing.   Cardiovascular: Negative for chest pain, orthopnea and leg swelling.  Gastrointestinal: Negative for abdominal pain, blood in stool, constipation, diarrhea, heartburn, melena, nausea and vomiting.  Genitourinary: Negative for dysuria, flank pain, frequency, hematuria and urgency.  Skin: Negative for rash.  Neurological: Negative for dizziness, tingling, tremors, sensory change, speech change, focal weakness, seizures, loss of consciousness, weakness and headaches.  Endo/Heme/Allergies: Negative for polydipsia.    Objective:     Vision/hearing:  Visual Acuity Screening   Right eye Left eye Both eyes  Without correction: 20/15 20/15 20/15   With correction:     Comments: Passed color test.  Horizontal field of vision is 85 degrees   Hearing Screening Comments: Passed whisper test.   Applicant can recognize and distinguish among traffic control signals and devices showing standard red, green, and amber colors.  Corrective lenses required: No  Monocular Vision?: No  Hearing aid requirement:  No  Physical Exam  Constitutional: He is oriented to person, place, and time. He appears well-developed and well-nourished.  HENT:  Head: Normocephalic and atraumatic.  Right Ear: Hearing, tympanic membrane, external ear and ear canal normal.  Left Ear: Hearing, tympanic membrane, external ear and ear canal normal.  Nose: Nose normal.  Mouth/Throat: Uvula is midline, oropharynx is clear and moist and mucous membranes are normal.  Eyes: Pupils are equal, round, and reactive to light. Conjunctivae, EOM and lids are normal. Right eye exhibits no discharge. Left eye exhibits no discharge. No scleral icterus.  Neck: Trachea normal and normal range of motion. Neck supple. Carotid bruit is not present.  Cardiovascular: Normal rate, regular rhythm, normal heart sounds, intact distal pulses and normal pulses.  No murmur heard. Pulmonary/Chest: Effort normal and breath sounds normal. No respiratory distress. He has no wheezes. He has no rhonchi. He has no rales.  Abdominal: Soft. Normal appearance and bowel sounds are normal. He exhibits no abdominal bruit. There is no tenderness.  obese  Musculoskeletal: Normal range of motion. He exhibits no edema or tenderness.  Lymphadenopathy:       Head (right side): No submental, no submandibular, no tonsillar, no preauricular, no posterior auricular and no occipital adenopathy present.       Head (left side): No submental, no submandibular, no tonsillar, no preauricular, no posterior auricular and no occipital adenopathy present.    He has no cervical adenopathy.  Neurological: He is alert and oriented to person, place, and time. He has normal strength and normal reflexes. No cranial nerve deficit or sensory deficit. Coordination and gait normal.  Skin: Skin is warm, dry and intact. No lesion and no rash noted. No erythema. No pallor.  Psychiatric: He has a normal mood and affect.  His speech is normal and behavior is normal. Judgment and thought content normal.     BP 134/88   Pulse 89   Temp 98.2 F (36.8 C) (Oral)   Resp 18   Ht 5\' 9"  (1.753 m)   Wt (!) 407 lb (184.6 kg)   SpO2 99%   BMI 60.10 kg/m   BP Readings from Last 3 Encounters:  03/06/18 134/88  01/08/16 136/88  10/13/15 164/91    Labs: Comments: UA. SG 1.020  GL neg  PR neg BL neg   Assessment:    Healthy male exam.  Meets standards in 18 CFR 391.41;  qualifies for 2 year certificate.    Plan:    Medical examiners certificate completed and printed. Return as needed.    Deliah Boston, MS, PA-C 1:31 PM, 03/06/2018

## 2022-03-03 ENCOUNTER — Emergency Department (HOSPITAL_BASED_OUTPATIENT_CLINIC_OR_DEPARTMENT_OTHER): Payer: Self-pay

## 2022-03-03 ENCOUNTER — Other Ambulatory Visit: Payer: Self-pay

## 2022-03-03 ENCOUNTER — Encounter (HOSPITAL_COMMUNITY): Payer: Self-pay

## 2022-03-03 ENCOUNTER — Emergency Department (HOSPITAL_COMMUNITY): Payer: Self-pay

## 2022-03-03 ENCOUNTER — Emergency Department (HOSPITAL_COMMUNITY)
Admission: EM | Admit: 2022-03-03 | Discharge: 2022-03-03 | Disposition: A | Discharge status: Home / Self Care | Payer: Self-pay | Attending: Emergency Medicine | Admitting: Emergency Medicine

## 2022-03-03 DIAGNOSIS — M79605 Pain in left leg: Secondary | ICD-10-CM

## 2022-03-03 DIAGNOSIS — F17200 Nicotine dependence, unspecified, uncomplicated: Secondary | ICD-10-CM | POA: Insufficient documentation

## 2022-03-03 DIAGNOSIS — M21612 Bunion of left foot: Secondary | ICD-10-CM | POA: Insufficient documentation

## 2022-03-03 DIAGNOSIS — M79609 Pain in unspecified limb: Secondary | ICD-10-CM

## 2022-03-03 DIAGNOSIS — M25469 Effusion, unspecified knee: Secondary | ICD-10-CM

## 2022-03-03 DIAGNOSIS — M1712 Unilateral primary osteoarthritis, left knee: Secondary | ICD-10-CM | POA: Insufficient documentation

## 2022-03-03 LAB — CBC WITH DIFFERENTIAL/PLATELET
Abs Immature Granulocytes: 0.02 10*3/uL (ref 0.00–0.07)
Basophils Absolute: 0 10*3/uL (ref 0.0–0.1)
Basophils Relative: 1 %
Eosinophils Absolute: 0.2 10*3/uL (ref 0.0–0.5)
Eosinophils Relative: 3 %
HCT: 42 % (ref 39.0–52.0)
Hemoglobin: 13.6 g/dL (ref 13.0–17.0)
Immature Granulocytes: 0 %
Lymphocytes Relative: 36 %
Lymphs Abs: 2 10*3/uL (ref 0.7–4.0)
MCH: 28.8 pg (ref 26.0–34.0)
MCHC: 32.4 g/dL (ref 30.0–36.0)
MCV: 89 fL (ref 80.0–100.0)
Monocytes Absolute: 0.4 10*3/uL (ref 0.1–1.0)
Monocytes Relative: 8 %
Neutro Abs: 2.8 10*3/uL (ref 1.7–7.7)
Neutrophils Relative %: 52 %
Platelets: 190 10*3/uL (ref 150–400)
RBC: 4.72 MIL/uL (ref 4.22–5.81)
RDW: 14.1 % (ref 11.5–15.5)
WBC: 5.4 10*3/uL (ref 4.0–10.5)
nRBC: 0 % (ref 0.0–0.2)

## 2022-03-03 LAB — BASIC METABOLIC PANEL
Anion gap: 9 (ref 5–15)
BUN: 16 mg/dL (ref 6–20)
CO2: 23 mmol/L (ref 22–32)
Calcium: 8.8 mg/dL — ABNORMAL LOW (ref 8.9–10.3)
Chloride: 112 mmol/L — ABNORMAL HIGH (ref 98–111)
Creatinine, Ser: 0.81 mg/dL (ref 0.61–1.24)
GFR, Estimated: >60 mL/min (ref >60)
Glucose, Bld: 96 mg/dL (ref 70–99)
Potassium: 4 mmol/L (ref 3.5–5.1)
Sodium: 144 mmol/L (ref 135–145)

## 2022-03-03 LAB — SEDIMENTATION RATE: Sed Rate: 12 mm/hr (ref 0–16)

## 2022-03-03 NOTE — Progress Notes (Addendum)
VASCULAR LAB    Left lower extremity has been performed.  See CV proc for preliminary results.  Messaged results to Dr. Karene Fry via secure chat  Sherren Kerns, RVT 03/03/2022, 11:34 AM

## 2022-03-03 NOTE — ED Provider Notes (Signed)
[facility] DEPT Provider Note   CSN: [id number] Arrival date & time: 03/03/22  6440     History  Chief Complaint  Patient presents with   Joint Swelling   foot wound    Mitchell Johnson is a 56 y.o. male.  HPI  56 year old male with medical history significant for obesity, HLD, gout, current smoker who presents to the emergency department with a complaint of left knee pain and swelling for the past 3 months, development of ulcerations along his left lower extremity and left foot.  The patient endorses claudication on ambulation.  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  He states that he has had gout flares in the past and does not feel that his knee swelling is consistent with that and has been ongoing for some time.  He denies any recent falls or trauma. He states that he has developed an ulcerative wound along his foot over the past month that is painful. No discharge. No redness. No fevers or chills.   Home Medications Prior to Admission medications   Not on File      Allergies    Patient has no known allergies.    Review of Systems   Review of Systems  All other systems reviewed and are negative.   Physical Exam Updated Vital Signs BP (!) 166/98 (BP Location: Left Arm)   Pulse 92   Temp 97.6 F (36.4 C) (Oral)   Resp 18   Ht '5\' 9"'  (1.753 m)   Wt (!) 163.3 kg   SpO2 96%   BMI 53.16 kg/m  Physical Exam Vitals and nursing note reviewed.  Constitutional:      General: He is not in acute distress.    Appearance: He is well-developed. He is obese.  HENT:     Head: Normocephalic and atraumatic.  Eyes:     Conjunctiva/sclera: Conjunctivae normal.  Cardiovascular:     Rate and Rhythm: Normal rate and regular rhythm.  Pulmonary:     Effort: Pulmonary effort is normal. No respiratory distress.     Breath sounds: Normal breath sounds.  Abdominal:     Palpations: Abdomen is soft.     Tenderness: There is no  abdominal tenderness.  Musculoskeletal:        General: No swelling.     Cervical back: Neck supple.     Comments: Ulcerative lesions along the patient's left lower extremity, 2+ DP pulses bilaterally. Bunion present along the patient's left foot.   Skin:    General: Skin is warm and dry.     Capillary Refill: Capillary refill takes less than 2 seconds.  Neurological:     Mental Status: He is alert.  Psychiatric:        Mood and Affect: Mood normal.     ED Results / Procedures / Treatments   Labs (all labs ordered are listed, but only abnormal results are displayed) Labs Reviewed  BASIC METABOLIC PANEL - Abnormal; Notable for the following components:      Result Value   Chloride 112 (*)    Calcium 8.8 (*)    All other components within normal limits  CBC WITH DIFFERENTIAL/PLATELET  SEDIMENTATION RATE  C-REACTIVE PROTEIN    EKG None  Radiology VAS [place] LOWER EXTREMITY VENOUS (DVT) (ONLY [facility] & [facility])  Result Date: 03/03/2022  Lower Venous DVT Study Patient Name:  Mitchell Johnson  Date of Exam:   03/03/2022 Medical Rec #: [id number]  Accession #:    [id number] Date of Birth: [birth year]        Patient Gender: M Patient Age:   48 years Exam Location:  [facility] Procedure:      VAS [place] LOWER EXTREMITY VENOUS (DVT) Referring Phys: Regan Lemming --------------------------------------------------------------------------------  Indications: Left knee Pain X 2-3 months.  Limitations: Body habitus. Comparison Study: No prior study on file Performing Technologist: Sharion Dove RVS  Examination Guidelines: A complete evaluation includes B-mode imaging, spectral Doppler, color Doppler, and power Doppler as needed of all accessible portions of each vessel. Bilateral testing is considered an integral part of a complete examination. Limited examinations for reoccurring indications may be performed as noted. The reflux portion of the exam is performed with the patient in reverse  Trendelenburg.  +-----+---------------+---------+-----------+----------+--------------+ RIGHTCompressibilityPhasicitySpontaneityPropertiesThrombus Aging +-----+---------------+---------+-----------+----------+--------------+ CFV  Full           Yes      Yes                                 +-----+---------------+---------+-----------+----------+--------------+   +---------+---------------+---------+-----------+----------+--------------+ LEFT     CompressibilityPhasicitySpontaneityPropertiesThrombus Aging +---------+---------------+---------+-----------+----------+--------------+ CFV      Full           Yes      Yes                                 +---------+---------------+---------+-----------+----------+--------------+ SFJ      Full                                                        +---------+---------------+---------+-----------+----------+--------------+ FV Prox  Full                                                        +---------+---------------+---------+-----------+----------+--------------+ FV Mid   Full                                                        +---------+---------------+---------+-----------+----------+--------------+ FV DistalFull                                                        +---------+---------------+---------+-----------+----------+--------------+ PFV      Full                                                        +---------+---------------+---------+-----------+----------+--------------+ POP      Full           Yes      Yes                                 +---------+---------------+---------+-----------+----------+--------------+  PTV      Full                                                        +---------+---------------+---------+-----------+----------+--------------+ PERO     Full                                                         +---------+---------------+---------+-----------+----------+--------------+     Summary: RIGHT: - No evidence of common femoral vein obstruction.  LEFT: - There is no evidence of deep vein thrombosis in the lower extremity.  - No cystic structure found in the popliteal fossa.  *See table(s) above for measurements and observations.    Preliminary    DG Foot Complete Left  Result Date: 03/03/2022 CLINICAL DATA:  Swelling and pain toe wound. EXAM: LEFT FOOT - COMPLETE 3+ VIEW COMPARISON:  None FINDINGS: There is no evidence of fracture or dislocation. There is no evidence of arthropathy or other focal bone abnormality. Soft tissues are unremarkable. IMPRESSION: No sign of fracture or osteomyelitis. Mild hallux valgus and degenerative change of the MTP joint of the great toe. Electronically Signed   By: Nelson Chimes M.D.   On: 03/03/2022 11:53   DG Knee Complete 4 Views Left  Result Date: 03/03/2022 CLINICAL DATA:  56 year old with knee pain and swelling for 2 months. EXAM: LEFT KNEE - COMPLETE 4+ VIEW COMPARISON:  None available FINDINGS: Moderate-to-marked tricompartmental osteoarthritic changes greatest in the medial and patellofemoral compartments. Soft tissues are unremarkable, no sign of joint effusion. No sign of fracture or of dislocation. IMPRESSION: Moderate-to-marked tricompartmental osteoarthritic changes. Electronically Signed   By: Zetta Bills M.D.   On: 03/03/2022 11:14    Procedures Procedures    Medications Ordered in ED Medications - No data to display  ED Course/ Medical Decision Making/ A&P                           Medical Decision Making Amount and/or Complexity of Data Reviewed Labs: ordered. Radiology: ordered.   56 year old male with medical history significant for obesity, HLD, gout, current smoker who presents to the emergency department with a complaint of left knee pain and swelling for the past 3 months, development of ulcerations along his left lower extremity  and left foot.  The patient endorses claudication on ambulation.  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  He states that he has had gout flares in the past and does not feel that his knee swelling is consistent with that and has been ongoing for some time.  He denies any recent falls or trauma. He states that he has developed an ulcerative wound along his foot over the past month that is painful. No discharge. No redness. No fevers or chills.   On arrival, the patient was vitally stable. Low concern for acute limb ischemia with intact pulses. No recent falls or trauma. Presenting with lesions along left lower extremity and symptoms of claudication concerning for PVD in this patient with a smoking history. Also appears to have a bunion along his left foot which is  causing him some discomfort. Will provide referrals to vascular and podiatry pending completion of initial workup today. Eval planned to include CBC, CMP, ESR, CRP, XR of the left foot and knee. Low concern for gout flare or septic arthritis given chronicity of symptoms. No erythema or warmth of the knee with intact active and passive ROM. Do not feel that emergent arthrocentesis is indicated. 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   DVT ultrasound performed negative for DVT.  X-ray imaging of the left knee was concerning for moderate to severe degenerative osteoarthritis of the knee. XR of the left foot revealed  No sign of fracture or osteomyelitis. Mild hallux valgus and  degenerative change of the MTP joint of the great toe.   Pt without signs of acute gout flare at this time. Low concern for septic arthritis.  Referral placed for follow-up with orthopedics given imaging findings.  Additionally, will place referral for follow-up with podiatry given the patient's bunion.  ABI arterial ultrasound ordered for outpatient, advised patient to follow-up with  his PCP to discuss results once performed.  Laboratory evaluation reviewed without leukocytosis or anemia, BMP without significant electrolyte abnormality, ESR and CRP collected and negative. Patient stable for discharge and continued outpatient management.    Final Clinical Impression(s) / ED Diagnoses Final diagnoses:  Knee swelling  Pain of left lower extremity  Bunion of left foot  Osteoarthritis of left knee, unspecified osteoarthritis type    Rx / DC Orders ED Discharge Orders          Ordered    Ambulatory referral to Podiatry        03/03/22 1034    VAS [place] ABI WITH/WO TBI        03/03/22 1034    Ambulatory referral to Vascular Surgery        03/03/22 1034    AMB referral to orthopedics  Status:  Canceled        03/03/22 1206    AMB referral to orthopedics        03/03/22 1206              Regan Lemming, MD 03/03/22 1331

## 2022-03-03 NOTE — ED Triage Notes (Signed)
Patient is a Naval architect. Patient has swelling and pain to he left knee x 2-3 months.  Patient also has a wound to the left toe area x 1 month.

## 2022-03-03 NOTE — Discharge Instructions (Addendum)
Please follow-up with orthopedics outpatient regarding your left knee. You likely have degenerative osteoarthritis of the knee with a joint effusion. Follow-up with podiatry regarding your foot bunion. Lastly, an ultrasound of your arteries has been ordered to be performed outpatient. Recommend you follow-up with your PCP after your obtain this to discuss results and if a referral to a vascular specialist would be necessary.

## 2022-10-04 ENCOUNTER — Emergency Department (HOSPITAL_COMMUNITY): Payer: Medicaid Other

## 2022-10-04 ENCOUNTER — Emergency Department (HOSPITAL_COMMUNITY)
Admission: EM | Admit: 2022-10-04 | Discharge: 2022-10-04 | Disposition: A | Discharge status: Home / Self Care | Payer: Medicaid Other | Attending: Emergency Medicine | Admitting: Emergency Medicine

## 2022-10-04 ENCOUNTER — Encounter (HOSPITAL_COMMUNITY): Payer: Self-pay

## 2022-10-04 ENCOUNTER — Emergency Department (HOSPITAL_BASED_OUTPATIENT_CLINIC_OR_DEPARTMENT_OTHER): Payer: Medicaid Other

## 2022-10-04 ENCOUNTER — Other Ambulatory Visit: Payer: Self-pay

## 2022-10-04 DIAGNOSIS — I82462 Acute embolism and thrombosis of left calf muscular vein: Secondary | ICD-10-CM | POA: Diagnosis not present

## 2022-10-04 DIAGNOSIS — M79605 Pain in left leg: Secondary | ICD-10-CM | POA: Diagnosis present

## 2022-10-04 LAB — CBC
HCT: 46.1 % (ref 39.0–52.0)
Hemoglobin: 14.7 g/dL (ref 13.0–17.0)
MCH: 28.7 pg (ref 26.0–34.0)
MCHC: 31.9 g/dL (ref 30.0–36.0)
MCV: 89.9 fL (ref 80.0–100.0)
Platelets: 221 10*3/uL (ref 150–400)
RBC: 5.13 MIL/uL (ref 4.22–5.81)
RDW: 13.6 % (ref 11.5–15.5)
WBC: 8.4 10*3/uL (ref 4.0–10.5)
nRBC: 0 % (ref 0.0–0.2)

## 2022-10-04 LAB — BASIC METABOLIC PANEL
Anion gap: 12 (ref 5–15)
BUN: 19 mg/dL (ref 6–20)
CO2: 22 mmol/L (ref 22–32)
Calcium: 8.8 mg/dL — ABNORMAL LOW (ref 8.9–10.3)
Chloride: 104 mmol/L (ref 98–111)
Creatinine, Ser: 0.99 mg/dL (ref 0.61–1.24)
GFR, Estimated: >60 mL/min (ref >60)
Glucose, Bld: 100 mg/dL — ABNORMAL HIGH (ref 70–99)
Potassium: 3.9 mmol/L (ref 3.5–5.1)
Sodium: 138 mmol/L (ref 135–145)

## 2022-10-04 MED ORDER — HYDROCODONE-ACETAMINOPHEN 5-325 MG PO TABS: 2.0000 | ORAL_TABLET | ORAL | 0 refills | Status: DC | PRN

## 2022-10-04 MED ORDER — APIXABAN 5 MG PO TABS
10.0000 mg | ORAL_TABLET | Freq: Once | ORAL | Status: AC
  Administered 2022-10-04: 10 mg via ORAL
  Filled 2022-10-04: qty 2

## 2022-10-04 MED ORDER — APIXABAN (ELIQUIS) EDUCATION KIT FOR DVT/PE PATIENTS: PACK | Freq: Once | Status: DC

## 2022-10-04 MED ORDER — OXYCODONE-ACETAMINOPHEN 5-325 MG PO TABS
1.0000 | ORAL_TABLET | Freq: Once | ORAL | Status: AC
  Administered 2022-10-04: 1 via ORAL
  Filled 2022-10-04: qty 1

## 2022-10-04 MED ORDER — APIXABAN (ELIQUIS) VTE STARTER PACK (10MG AND 5MG): ORAL_TABLET | ORAL | 0 refills | Status: DC

## 2022-10-04 MED ORDER — HYDROCODONE-ACETAMINOPHEN 5-325 MG PO TABS
2.0000 | ORAL_TABLET | Freq: Once | ORAL | Status: AC
  Administered 2022-10-04: 2 via ORAL
  Filled 2022-10-04: qty 2

## 2022-10-04 MED ORDER — OXYCODONE-ACETAMINOPHEN 5-325 MG PO TABS: 1.0000 | ORAL_TABLET | Freq: Four times a day (QID) | ORAL | 0 refills | Status: DC | PRN

## 2022-10-04 MED ORDER — APIXABAN 5 MG PO TABS: 10.0000 mg | ORAL_TABLET | Freq: Two times a day (BID) | ORAL | 0 refills | Status: DC

## 2022-10-04 NOTE — Progress Notes (Signed)
Lower extremity venous left study completed.  Preliminary results relayed to [place], MD.  See CV Proc for preliminary results report.   Neilani Duffee, RDMS, RVT  

## 2022-10-04 NOTE — ED Provider Triage Note (Signed)
Emergency Medicine Provider Triage Evaluation Note  Mitchell Johnson , a 57 y.o. male  was evaluated in triage.  Pt complains of left lower extremity swelling and pain. Reports he has had this pain before, came here and noted to have effusion in knee. Patient reports in last week pain worsened. Patient reports he stood up and heard pop in left knee and ever since then has had pain and swelling to knee down to ankle. States reduced rom secondary to swelling. Denies chest pain or shortness of breath  Review of Systems  Positive:  Negative:   Physical Exam  BP (!) 180/96 (BP Location: Left Arm)   Pulse 88   Temp 98.6 F (37 C) (Oral)   Resp 16   SpO2 100%  Gen:   Awake, no distress   Resp:  Normal effort  MSK:   Moves extremities without difficulty  Other:    Medical Decision Making  Medically screening exam initiated at 3:42 PM.  Appropriate orders placed.  [name] was informed that the remainder of the evaluation will be completed by another provider, this initial triage assessment does not replace that evaluation, and the importance of remaining in the ED until their evaluation is complete.     Azucena Cecil, PA-C 10/04/22 1546

## 2022-10-04 NOTE — ED Triage Notes (Signed)
Patient said his left leg has been hurting for 6 months along with left ankle swelling. Patient drives trucks and sits a lot. Never had a blood clot before.

## 2022-10-04 NOTE — Discharge Instructions (Addendum)
Your US showed a blood clot in your leg. Please take the Eliquis as prescribed. Be on the look out for a call from a Education officer, museum for help with your medication refills. Take Vicodin as needed for pain, elevate your leg, use compression stocking to reduce swelling.  I recommend close follow-up with primary care physician for further evaluation management.  Return to the ER if new or worsening symptoms. Information on my medicine - ELIQUIS (apixaban)  This medication education was reviewed with me or my healthcare representative as part of my discharge preparation.    Why was Eliquis prescribed for you? Eliquis was prescribed to treat blood clots that may have been found in the veins of your legs (deep vein thrombosis) or in your lungs (pulmonary embolism) and to reduce the risk of them occurring again.  What do You need to know about Eliquis ? The starting dose is 10 mg (two 5 mg tablets) taken TWICE daily for the FIRST SEVEN (7) DAYS, then on 10/12/22 the dose is reduced to ONE 5 mg tablet taken TWICE daily.  Eliquis may be taken with or without food.   Try to take the dose about the same time in the morning and in the evening. If you have difficulty swallowing the tablet whole please discuss with your pharmacist how to take the medication safely.  Take Eliquis exactly as prescribed and DO NOT stop taking Eliquis without talking to the doctor who prescribed the medication.  Stopping may increase your risk of developing a new blood clot.  Refill your prescription before you run out.  After discharge, you should have regular check-up appointments with your healthcare provider that is prescribing your Eliquis.    What do you do if you miss a dose? If a dose of ELIQUIS is not taken at the scheduled time, take it as soon as possible on the same day and twice-daily administration should be resumed. The dose should not be doubled to make up for a missed dose.  Important Safety Information A  possible side effect of Eliquis is bleeding. You should call your healthcare provider right away if you experience any of the following: Bleeding from an injury or your nose that does not stop. Unusual colored urine (red or dark brown) or unusual colored stools (red or black). Unusual bruising for unknown reasons. A serious fall or if you hit your head (even if there is no bleeding).  Some medicines may interact with Eliquis and might increase your risk of bleeding or clotting while on Eliquis. To help avoid this, consult your healthcare provider or pharmacist prior to using any new prescription or non-prescription medications, including herbals, vitamins, non-steroidal anti-inflammatory drugs (NSAIDs) and supplements.  This website has more information on Eliquis (apixaban): http://www.eliquis.com/eliquis/home   Your US showed a blood clot in your leg. Please take the Eliquis as prescribed. Be on the look out for a call from a Education officer, museum for help with your medication refills. Take Vicodin as needed for pain, elevate your leg, use compression stocking to reduce swelling.  I recommend close follow-up with primary care physician for further evaluation management.  Return to the ER if new or worsening symptoms.

## 2022-10-04 NOTE — ED Provider Notes (Signed)
[place] EMERGENCY DEPARTMENT AT [facility] Provider Note   CSN: [id number] Arrival date & time: 10/04/22  1517     History  Chief Complaint  Patient presents with   Leg Pain    Mitchell Johnson is a 57 y.o. male with a past history of hyperlipidemia presenting today for evaluation of left leg pain.  Patient reports he has had left leg pain on and off for the past year.  He states that in the last 2 weeks the pain has gotten worse with increased swelling.  This morning when he was trying to get up on a truck he heard a pop on his left knee since then the pain has been getting worse.  Patient reports he also noticed swelling on the left leg more than right.  He denies any recent surgery or travel.  No family history of DVT or PE.  No personal history of cancer.  Patient is not taking any blood thinners.  He denies chest pain, shortness of breath, nausea, vomiting.  He states he is able to ambulate with some difficulty due to pain.   Leg Pain   Past Medical History:  Diagnosis Date   Hyperlipidemia    History reviewed. No pertinent surgical history.   Home Medications Prior to Admission medications   Medication Sig Start Date End Date Taking? Authorizing Provider  apixaban (ELIQUIS) 5 MG TABS tablet Take 2 tablets (10 mg total) by mouth 2 (two) times daily for 7 days. Then 5mg  (1 tab) 2 (two) times daily for the rest of the month. 10/04/22 10/11/22 Yes 12/10/22, PA  APIXABAN Jeanelle Malling) VTE STARTER PACK (10MG  AND 5MG ) Take as directed on package: start with two-5mg  tablets twice daily for 7 days. On day 8, switch to one-5mg  tablet twice daily. 10/04/22   , MD  HYDROcodone-acetaminophen (NORCO/VICODIN) 5-325 MG tablet Take 2 tablets by mouth every 4 (four) hours as needed for up to 15 doses. 10/04/22   12/03/22, MD      Allergies    Patient has no known allergies.    Review of Systems   Review of Systems Negative except as per HPI.  Physical  Exam Updated Vital Signs BP (!) 169/87   Pulse 92   Temp 98.6 F (37 C) (Oral)   Resp 16   SpO2 100%  Physical Exam Vitals and nursing note reviewed.  Constitutional:      Appearance: Normal appearance.  HENT:     Head: Normocephalic and atraumatic.     Mouth/Throat:     Mouth: Mucous membranes are moist.  Eyes:     General: No scleral icterus. Cardiovascular:     Rate and Rhythm: Normal rate and regular rhythm.     Pulses: Normal pulses.     Heart sounds: Normal heart sounds.  Pulmonary:     Effort: Pulmonary effort is normal.     Breath sounds: Normal breath sounds.  Abdominal:     General: Abdomen is flat.     Palpations: Abdomen is soft.     Tenderness: There is no abdominal tenderness.  Musculoskeletal:        General: No deformity.     Comments: TTP to entire L leg.  Skin:    General: Skin is warm.     Findings: No rash.  Neurological:     General: No focal deficit present.     Mental Status: He is alert.  Psychiatric:        Mood  and Affect: Mood normal.     ED Results / Procedures / Treatments   Labs (all labs ordered are listed, but only abnormal results are displayed) Labs Reviewed  BASIC METABOLIC PANEL - Abnormal; Notable for the following components:      Result Value   Glucose, Bld 100 (*)    Calcium 8.8 (*)    All other components within normal limits  CBC    EKG None  Radiology VAS [place] LOWER EXTREMITY VENOUS (DVT) (7a-7p)  Result Date: 10/04/2022  Lower Venous DVT Study Patient Name:  TRELYN VANDERLINDE  Date of Exam:   10/04/2022 Medical Rec #: [id number]        Accession #:    [id number] Date of Birth: Sep 16, 1965        Patient Gender: M Patient Age:   23 years Exam Location:  [facility] Procedure:      VAS [place] LOWER EXTREMITY VENOUS (DVT) Referring Phys: Genevive Bi --------------------------------------------------------------------------------  Indications: Left leg pain- patient endorses pop and pain/swelling from knee to ankle.   Limitations: Poor ultrasound/tissue interface. Overlying subcutaneous edema and habitus. Comparison Study: 03-03-2022 Prior left lower extremity venous study was                   negative for DVT. Performing Technologist: Darlin Coco RDMS, RVT  Examination Guidelines: A complete evaluation includes B-mode imaging, spectral Doppler, color Doppler, and power Doppler as needed of all accessible portions of each vessel. Bilateral testing is considered an integral part of a complete examination. Limited examinations for reoccurring indications may be performed as noted. The reflux portion of the exam is performed with the patient in reverse Trendelenburg.  +-----+---------------+---------+-----------+----------+--------------+ RIGHTCompressibilityPhasicitySpontaneityPropertiesThrombus Aging +-----+---------------+---------+-----------+----------+--------------+ CFV  Full           Yes      Yes                                 +-----+---------------+---------+-----------+----------+--------------+   +---------+---------------+---------+-----------+----------+-------------------+ LEFT     CompressibilityPhasicitySpontaneityPropertiesThrombus Aging      +---------+---------------+---------+-----------+----------+-------------------+ CFV      Full           Yes      Yes                                      +---------+---------------+---------+-----------+----------+-------------------+ SFJ      Full                                                             +---------+---------------+---------+-----------+----------+-------------------+ FV Prox  Full                                                             +---------+---------------+---------+-----------+----------+-------------------+ FV Mid   Full                                                             +---------+---------------+---------+-----------+----------+-------------------+  FV DistalFull                                                              +---------+---------------+---------+-----------+----------+-------------------+ PFV      Full                                                             +---------+---------------+---------+-----------+----------+-------------------+ POP      Full           Yes      Yes                                      +---------+---------------+---------+-----------+----------+-------------------+ PTV      Full                                                             +---------+---------------+---------+-----------+----------+-------------------+ PERO                                                  Not well visualized +---------+---------------+---------+-----------+----------+-------------------+ Gastroc  None           No       No                   Age Indeterminate   +---------+---------------+---------+-----------+----------+-------------------+   Summary: RIGHT: - No evidence of common femoral vein obstruction.  LEFT: - Findings consistent with age indeterminate deep vein thrombosis involving the left gastrocnemius veins.  - No cystic structure found in the popliteal fossa.  *See table(s) above for measurements and observations.    Preliminary    DG Ankle Complete Left  Result Date: 10/04/2022 CLINICAL DATA:  Felipa Evener pop last week in knee. Now with swelling from knee to ankle. EXAM: LEFT ANKLE COMPLETE - 3+ VIEW COMPARISON:  None Available. FINDINGS: Diffuse soft tissue edema identified. No signs acute fracture or dislocation. Small plantar heel spur. No significant arthropathy. IMPRESSION: 1. Diffuse soft tissue edema. 2. No acute bone abnormality. Electronically Signed   By: Kerby Moors M.D.   On: 10/04/2022 16:20   DG Knee Complete 4 Views Left  Result Date: 10/04/2022 CLINICAL DATA:  Twisting injury. EXAM: LEFT KNEE - COMPLETE 4+ VIEW COMPARISON:  03/03/2022 FINDINGS: Lateral view is artifact degraded, limited evaluation for  joint effusion. Moderate patellofemoral and severe medial compartment joint space narrowing and subchondral sclerosis again identified. 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 No acute fracture or dislocation. IMPRESSION: Degenerative change, without acute osseous finding. Limitations as detailed above. Electronically Signed   By: Abigail Miyamoto M.D.   On: 10/04/2022 16:20    Procedures Procedures    Medications Ordered in ED Medications  oxyCODONE-acetaminophen (PERCOCET/ROXICET) 5-325 MG per tablet 1  tablet (1 tablet Oral Given 10/04/22 1548)  apixaban (ELIQUIS) tablet 10 mg (10 mg Oral Given 10/04/22 1741)  HYDROcodone-acetaminophen (NORCO/VICODIN) 5-325 MG per tablet 2 tablet (2 tablets Oral Given 10/04/22 1807)    ED Course/ Medical Decision Making/ A&P                             Medical Decision Making Risk Prescription drug management.   This patient presents to the ED for left leg pain, this involves an extensive number of treatment options, and is a complaint that carries with a high risk of complications and morbidity.  The differential diagnosis includes fracture, dislocation, DVT, peripheral vascular disease, cellulitis, CHF infectious etiology.  This is not an exhaustive list.  Lab tests: I ordered and personally interpreted labs.  The pertinent results include: WBC unremarkable. Hbg unremarkable. Platelets unremarkable. No electrolyte abnormalities noted. BUN, creatinine unremarkable.  Imaging studies: I ordered imaging studies. I personally reviewed, interpreted imaging and agree with the radiologist's interpretations. The results include: Ultrasound of the left leg showed no evidence of deep vein thrombosis involving the left gastrocnemius veins.  X-ray of the left knee and ankle show no acute bony abnormalities.  Problem list/ ED course/ Critical interventions/ Medical management: HPI: See above Vital signs within normal range and stable throughout  visit. Laboratory/imaging studies significant for: See above. On physical examination, patient is afebrile and appears in no acute distress.  This patient presents with left leg pain consistent with DVT of the gastrocnemius vein.  Percocet and Norco ordered for pain.  Low suspicion for PE as patient denies shortness of breath, O2 sat 100% on room air, heart rate within normal limit, and patient has low Well score.  I sent a 1 month supply of Eliquis.  I contacted Darletta Moll social worker through secure chat as patient has no insurance and needs help with medication refills and DME due to difficulty with ambulation.  Patient's mom requested admission however I do not think patient met criteria for admission at this point.  He is able to ambulate with some difficulty which I ordered crutches and contacted social worker for extra home assistance. I also sent a course of Norco for pain control.  Patient was seen by Dia Sitter pharmacist who discussed with the patient about Eliquis dosage and access to the medication after 1 month of free supply. I have reviewed the patient home medicines and have made adjustments as needed.  Cardiac monitoring/EKG: The patient was maintained on a cardiac monitor.  I personally reviewed and interpreted the cardiac monitor which showed an underlying rhythm of: sinus rhythm.  Additional history obtained: External records from outside source obtained and reviewed including: Chart review including previous notes, labs, imaging.  Disposition Continued outpatient therapy. Follow-up with PCP recommended for reevaluation of symptoms. Treatment plan discussed with patient.  Pt acknowledged understanding was agreeable to the plan. Worrisome signs and symptoms were discussed with patient, and patient acknowledged understanding to return to the ED if they noticed these signs and symptoms. Patient was stable upon discharge.   This chart was dictated using voice recognition software.   Despite best efforts to proofread,  errors can occur which can change the documentation meaning.          Final Clinical Impression(s) / ED Diagnoses Final diagnoses:  Left leg pain  Acute deep vein thrombosis (DVT) of calf muscle vein of left lower extremity ([place])    Rx /  DC Orders ED Discharge Orders          Ordered    APIXABAN (ELIQUIS) VTE STARTER PACK (10MG  AND 5MG )  Status:  Discontinued        10/04/22 1711    oxyCODONE-acetaminophen (PERCOCET/ROXICET) 5-325 MG tablet  Every 6 hours PRN,   Status:  Discontinued        10/04/22 1715    apixaban (ELIQUIS) 5 MG TABS tablet  2 times daily        10/04/22 1734    HYDROcodone-acetaminophen (NORCO/VICODIN) 5-325 MG tablet  Every 4 hours PRN,   Status:  Discontinued        10/04/22 1833    APIXABAN (ELIQUIS) VTE STARTER PACK (10MG  AND 5MG )        10/04/22 1916    HYDROcodone-acetaminophen (NORCO/VICODIN) 5-325 MG tablet  Every 4 hours PRN        10/04/22 1916              , PA 10/04/22 2021    12/03/22, MD 10/04/22 2031

## 2022-10-15 ENCOUNTER — Encounter (HOSPITAL_COMMUNITY): Payer: Self-pay

## 2022-10-15 ENCOUNTER — Emergency Department (HOSPITAL_COMMUNITY)
Admission: EM | Admit: 2022-10-15 | Discharge: 2022-10-15 | Disposition: A | Discharge status: Home / Self Care | Payer: Medicaid Other | Attending: Emergency Medicine | Admitting: Emergency Medicine

## 2022-10-15 ENCOUNTER — Emergency Department (HOSPITAL_COMMUNITY): Payer: Medicaid Other

## 2022-10-15 ENCOUNTER — Emergency Department (HOSPITAL_BASED_OUTPATIENT_CLINIC_OR_DEPARTMENT_OTHER): Payer: Medicaid Other

## 2022-10-15 ENCOUNTER — Other Ambulatory Visit: Payer: Self-pay

## 2022-10-15 DIAGNOSIS — R2243 Localized swelling, mass and lump, lower limb, bilateral: Secondary | ICD-10-CM | POA: Diagnosis not present

## 2022-10-15 DIAGNOSIS — I70211 Atherosclerosis of native arteries of extremities with intermittent claudication, right leg: Secondary | ICD-10-CM | POA: Diagnosis not present

## 2022-10-15 DIAGNOSIS — M79604 Pain in right leg: Secondary | ICD-10-CM | POA: Diagnosis present

## 2022-10-15 DIAGNOSIS — I739 Peripheral vascular disease, unspecified: Secondary | ICD-10-CM

## 2022-10-15 DIAGNOSIS — Z7901 Long term (current) use of anticoagulants: Secondary | ICD-10-CM | POA: Diagnosis not present

## 2022-10-15 DIAGNOSIS — R000 Tachycardia, unspecified: Secondary | ICD-10-CM | POA: Diagnosis not present

## 2022-10-15 DIAGNOSIS — R52 Pain, unspecified: Secondary | ICD-10-CM | POA: Diagnosis not present

## 2022-10-15 HISTORY — DX: Peripheral vascular disease, unspecified: I73.9

## 2022-10-15 LAB — CBC WITH DIFFERENTIAL/PLATELET
Abs Immature Granulocytes: 0.01 10*3/uL (ref 0.00–0.07)
Basophils Absolute: 0 10*3/uL (ref 0.0–0.1)
Basophils Relative: 1 %
Eosinophils Absolute: 0.1 10*3/uL (ref 0.0–0.5)
Eosinophils Relative: 1 %
HCT: 48.5 % (ref 39.0–52.0)
Hemoglobin: 15.6 g/dL (ref 13.0–17.0)
Immature Granulocytes: 0 %
Lymphocytes Relative: 32 %
Lymphs Abs: 2.2 10*3/uL (ref 0.7–4.0)
MCH: 28.1 pg (ref 26.0–34.0)
MCHC: 32.2 g/dL (ref 30.0–36.0)
MCV: 87.4 fL (ref 80.0–100.0)
Monocytes Absolute: 0.5 10*3/uL (ref 0.1–1.0)
Monocytes Relative: 7 %
Neutro Abs: 4.1 10*3/uL (ref 1.7–7.7)
Neutrophils Relative %: 59 %
Platelets: 278 10*3/uL (ref 150–400)
RBC: 5.55 MIL/uL (ref 4.22–5.81)
RDW: 12.7 % (ref 11.5–15.5)
WBC: 6.9 10*3/uL (ref 4.0–10.5)
nRBC: 0 % (ref 0.0–0.2)

## 2022-10-15 LAB — BASIC METABOLIC PANEL
Anion gap: 13 (ref 5–15)
BUN: 21 mg/dL — ABNORMAL HIGH (ref 6–20)
CO2: 24 mmol/L (ref 22–32)
Calcium: 9.7 mg/dL (ref 8.9–10.3)
Chloride: 100 mmol/L (ref 98–111)
Creatinine, Ser: 1.09 mg/dL (ref 0.61–1.24)
GFR, Estimated: >60 mL/min (ref >60)
Glucose, Bld: 113 mg/dL — ABNORMAL HIGH (ref 70–99)
Potassium: 4.7 mmol/L (ref 3.5–5.1)
Sodium: 137 mmol/L (ref 135–145)

## 2022-10-15 LAB — BRAIN NATRIURETIC PEPTIDE: B Natriuretic Peptide: 10 pg/mL (ref 0.0–100.0)

## 2022-10-15 MED ORDER — IOHEXOL 350 MG/ML SOLN
100.0000 mL | Freq: Once | INTRAVENOUS | Status: AC | PRN
  Administered 2022-10-15: 100 mL via INTRAVENOUS

## 2022-10-15 MED ORDER — OXYCODONE-ACETAMINOPHEN 5-325 MG PO TABS
1.0000 | ORAL_TABLET | Freq: Once | ORAL | Status: AC
  Administered 2022-10-15: 1 via ORAL
  Filled 2022-10-15: qty 1

## 2022-10-15 NOTE — Progress Notes (Signed)
BLE venous duplex has been completed.  Preliminary results given to Theodis Blaze, PA-C.   Results can be found under chart review under CV PROC. 10/15/2022 3:41 PM Genesys Coggeshall RVT, RDMS

## 2022-10-15 NOTE — Discharge Instructions (Signed)
You were seen in the emergency department today for lower leg pain.  You likely have a condition called claudication.  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  We talked with the vascular surgeon who recommends seeing you in the clinic.  I have sent a referral to his office and his office staff are aware that you need to be seen.  If you do not receive a call by Monday please call and schedule an appointment.  I placed their information in your discharge paperwork.  Please return to emergency department if you have significantly worsening symptoms.

## 2022-10-15 NOTE — ED Triage Notes (Signed)
Pt concerned about pain in bil legs, concern for blood clot is reason for coming. Inner bil pain from groin to foot.

## 2022-10-15 NOTE — ED Provider Notes (Signed)
[facility] AT [facility] Provider Note   CSN: [id number] Arrival date & time: 10/15/22  1135     History  Chief Complaint  Patient presents with   Leg Pain    Mitchell Johnson is a 57 y.o. male.  With past medical history of hyperlipidemia, DVT on Eliquis is to the emergency department with leg pain.  Patient states that he is concerned that he has a blood clot in his right leg.  He states that he was here 2 weeks ago and diagnosed with DVT and placed on Eliquis.  At shortly after this visit he was having pain develop in his right lower extremity.  He also feels like his legs are swollen "like there is fluid on them."  Pain is nonradiating.  He denies numbness or tingling to the bilateral lower extremities.  No back injury.  Denies missing any doses of his Eliquis.  He denies chest pain or shortness of breath or palpitations.  Denies a history of heart failure and does not take diuretics.  On chart review he was seen on 10/04/2022 having left leg pain.  To have DVT of the gastrinomas vein.  At that time low suspicion for PE.  HPI     Home Medications Prior to Admission medications   Medication Sig Start Date End Date Taking? Authorizing Provider  apixaban (ELIQUIS) 5 MG TABS tablet Take 2 tablets (10 mg total) by mouth 2 (two) times daily for 7 days. Then 59m (1 tab) 2 (two) times daily for the rest of the month. 10/04/22 10/11/22  LRex Kras PA  APIXABAN (Arne [place] VTE STARTER PACK (10MG AND 5MG) Take as directed on package: start with two-534mtablets twice daily for 7 days. On day 8, switch to one-27m72mablet twice daily. 10/04/22   HavIsla PenceD  HYDROcodone-acetaminophen (NORCO/VICODIN) 5-325 MG tablet Take 2 tablets by mouth every 4 (four) hours as needed for up to 15 doses. 10/04/22   HavIsla PenceD      Allergies    Patient has no known allergies.    Review of Systems   Review of Systems  Cardiovascular:  Positive for leg swelling.  All  other systems reviewed and are negative.   Physical Exam Updated Vital Signs BP (!) 185/115 (BP Location: Left Arm)   Pulse 93   Temp 98.1 F (36.7 C) (Oral)   Resp (!) 8   Ht 5' 9"$  (1.753 m)   Wt (!) 170.1 kg   SpO2 100%   BMI 55.38 kg/m  Physical Exam Vitals and nursing note reviewed.  Constitutional:      General: He is not in acute distress.    Appearance: Normal appearance. He is obese. He is not ill-appearing.  HENT:     Head: Normocephalic.  Eyes:     General: No scleral icterus.    Extraocular Movements: Extraocular movements intact.  Cardiovascular:     Rate and Rhythm: Regular rhythm. Tachycardia present.     Pulses: Normal pulses.  Pulmonary:     Effort: Pulmonary effort is normal. No respiratory distress.     Breath sounds: Normal breath sounds.  Abdominal:     General: Bowel sounds are normal.     Palpations: Abdomen is soft.  Musculoskeletal:     Right lower leg: Edema present.     Left lower leg: Edema present.     Comments: Non pitting edema to bilateral lower extremities   Skin:    General: Skin is  warm and dry.     Capillary Refill: Capillary refill takes less than 2 seconds.  Neurological:     General: No focal deficit present.     Mental Status: He is alert and oriented to person, place, and time. Mental status is at baseline.  Psychiatric:        Mood and Affect: Mood normal.        Behavior: Behavior normal.        Thought Content: Thought content normal.        Judgment: Judgment normal.     ED Results / Procedures / Treatments   Labs (all labs ordered are listed, but only abnormal results are displayed) Labs Reviewed  BASIC METABOLIC PANEL - Abnormal; Notable for the following components:      Result Value   Glucose, Bld 113 (*)    BUN 21 (*)    All other components within normal limits  BRAIN NATRIURETIC PEPTIDE  CBC WITH DIFFERENTIAL/PLATELET    EKG None  Radiology CT Angio Aortobifemoral W and/or Wo Contrast  Result  Date: 10/15/2022 CLINICAL DATA:  Bilateral leg pain, concern for blood clot EXAM: CT ANGIOGRAPHY OF ABDOMINAL AORTA WITH ILIOFEMORAL RUNOFF TECHNIQUE: Multidetector CT imaging of the abdomen, pelvis and lower extremities was performed using the standard protocol during bolus administration of intravenous contrast. Multiplanar CT image reconstructions and MIPs were obtained to evaluate the vascular anatomy. RADIATION DOSE REDUCTION: This exam was performed according to the departmental dose-optimization program which includes automated exposure control, adjustment of the mA and/or kV according to patient size and/or use of iterative reconstruction technique. CONTRAST:  100 mL OMNIPAQUE IOHEXOL 350 MG/ML SOLN COMPARISON:  None Available. FINDINGS: VASCULAR Normal contour and caliber of the abdominal aorta. No evidence of aneurysm, dissection, or other acute aortic pathology. Moderate mixed calcific atherosclerosis. Standard branching pattern of the abdominal aorta with solitary bilateral renal arteries. Serpiginous tangle of arterial branch vessels about the pancreatic head and central small bowel mesentery (series 5, image 74). 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 Outflow: Common, superficial and profunda femoral arteries and the popliteal artery are patent without evidence of aneurysm, dissection, vasculitis or significant stenosis. Mild mixed calcific atherosclerosis. Runoff: Scattered atherosclerosis at the popliteal bifurcation. Patent three vessel runoff to the ankle. LEFT Lower Extremity Inflow: Common, internal and external iliac arteries are patent without evidence of aneurysm, dissection, vasculitis or significant stenosis. Moderate mixed calcific atherosclerosis. Outflow: Common, superficial and profunda femoral arteries and the popliteal  artery are patent without evidence of aneurysm, dissection, vasculitis or significant stenosis. Mild mixed calcific atherosclerosis. Runoff: Patent three vessel runoff to the ankle. Veins: No obvious venous abnormality within the limitations of this arterial phase study. Review of the MIP images confirms the above findings. NON-VASCULAR Lower chest: No acute abnormality.  Coronary artery calcifications. Hepatobiliary: No solid liver abnormality is seen. No gallstones, gallbladder wall thickening, or biliary dilatation. Pancreas: Unremarkable. No pancreatic ductal dilatation or surrounding inflammatory changes. Spleen: Normal in size without significant abnormality. Adrenals/Urinary Tract: Adrenal glands are unremarkable. Kidneys are normal, without renal calculi, solid lesion, or hydronephrosis. Bladder is unremarkable. Stomach/Bowel: Stomach is within normal limits. Appendix appears normal. No evidence of bowel wall thickening, distention, or inflammatory changes. Sigmoid diverticulosis. Lymphatic: No enlarged abdominal or pelvic lymph nodes. Reproductive: No mass or other significant abnormality. Other: No abdominal wall hernia  or abnormality. No ascites. Musculoskeletal: No acute or significant osseous findings. IMPRESSION: 1. 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 2. No other occlusion or high-grade stenosis of the lower extremity arteries. 3. Moderate mixed calcific atherosclerosis. 4. Serpiginous tangle of arterial branch vessels about the pancreatic head and central small bowel mesentery, consistent with a vascular malformation. 5. Sigmoid diverticulosis without evidence of acute diverticulitis. 6. Coronary artery disease. Electronically Signed   By: Delanna Ahmadi M.D.   On: 10/15/2022 17:43   VAS [place] LOWER EXTREMITY VENOUS (DVT) (7a-7p)  Result Date: 10/15/2022  Lower  Venous DVT Study Patient Name:  KENYATA SITTLER  Date of Exam:   10/15/2022 Medical Rec #: [id number]        Accession #:    [id number] Date of Birth: [birth year]        Patient Gender: M Patient Age:   69 years Exam Location:  [facility] Procedure:      VAS [place] LOWER EXTREMITY VENOUS (DVT) Referring Phys: Theodis Blaze --------------------------------------------------------------------------------  Indications: Pain.  Anticoagulation: Eliquis. Comparison Study: Previous exam on 10/04/22 was positive for DVT in left                   gastrocnemius vein. Performing Technologist: Rogelia Rohrer RVT, RDMS  Examination Guidelines: A complete evaluation includes B-mode imaging, spectral Doppler, color Doppler, and power Doppler as needed of all accessible portions of each vessel. Bilateral testing is considered an integral part of a complete examination. Limited examinations for reoccurring indications may be performed as noted. The reflux portion of the exam is performed with the patient in reverse Trendelenburg.  +---------+---------------+---------+-----------+----------+--------------+ RIGHT    CompressibilityPhasicitySpontaneityPropertiesThrombus Aging +---------+---------------+---------+-----------+----------+--------------+ CFV      Full           Yes      Yes                                 +---------+---------------+---------+-----------+----------+--------------+ SFJ      Full                                                        +---------+---------------+---------+-----------+----------+--------------+ FV Prox  Full           Yes      Yes                                 +---------+---------------+---------+-----------+----------+--------------+ FV Mid   Full           Yes      Yes                                 +---------+---------------+---------+-----------+----------+--------------+ FV DistalFull           Yes      Yes                                  +---------+---------------+---------+-----------+----------+--------------+ PFV      Full                                                        +---------+---------------+---------+-----------+----------+--------------+  POP      Full           Yes      Yes                                 +---------+---------------+---------+-----------+----------+--------------+ PTV      Full                                                        +---------+---------------+---------+-----------+----------+--------------+ PERO     Full                                                        +---------+---------------+---------+-----------+----------+--------------+   +---------+---------------+---------+-----------+----------+--------------+ LEFT     CompressibilityPhasicitySpontaneityPropertiesThrombus Aging +---------+---------------+---------+-----------+----------+--------------+ CFV      Full           Yes      Yes                                 +---------+---------------+---------+-----------+----------+--------------+ SFJ      Full                                                        +---------+---------------+---------+-----------+----------+--------------+ FV Prox  Full           Yes      Yes                                 +---------+---------------+---------+-----------+----------+--------------+ FV Mid   Full           Yes      Yes                                 +---------+---------------+---------+-----------+----------+--------------+ FV DistalFull           Yes      Yes                                 +---------+---------------+---------+-----------+----------+--------------+ PFV      Full                                                        +---------+---------------+---------+-----------+----------+--------------+ POP      Full           Yes      Yes                                  +---------+---------------+---------+-----------+----------+--------------+ PTV  Full                                                        +---------+---------------+---------+-----------+----------+--------------+ PERO     Full                                                        +---------+---------------+---------+-----------+----------+--------------+ Gastroc  Full                                         at origin      +---------+---------------+---------+-----------+----------+--------------+ gastrocnemius vein appears patent and fully compresses at it's origin in the popliteal fossa - unable to visualize in calf.    Summary: BILATERAL: - No evidence of deep vein thrombosis seen in the lower extremities, bilaterally. -No evidence of popliteal cyst, bilaterally.   *See table(s) above for measurements and observations. Electronically signed by Monica Martinez MD on 10/15/2022 at 3:55:00 PM.    Final     Procedures Procedures    Medications Ordered in ED Medications  oxyCODONE-acetaminophen (PERCOCET/ROXICET) 5-325 MG per tablet 1 tablet (1 tablet Oral Given 10/15/22 1612)  iohexol (OMNIPAQUE) 350 MG/ML injection 100 mL (100 mLs Intravenous Contrast Given 10/15/22 1719)    ED Course/ Medical Decision Making/ A&P  Medical Decision Making Amount and/or Complexity of Data Reviewed Labs: ordered. Radiology: ordered.  Risk Prescription drug management.  Initial Impression and Ddx 57 year old male who presents to the emergency department with leg pain. Patient PMH that increases complexity of ED encounter: DVT Differential: Fracture, subluxation, septic joint, DVT, compartment syndrome, claudication etc.  Interpretation of Diagnostics I independent reviewed and interpreted the labs as followed: CBC, BMP, BNP unremarkable  - I independently visualized the following imaging with scope of interpretation limited to determining acute life threatening conditions  related to emergency care: Bilateral DVT, which revealed negative findings  Patient Reassessment and Ultimate Disposition/Management 57 year old male who presents to the emergency department with bilateral lower extremity pain. Initial concern over DVT as he recently had DVT in the left lower extremity.  Has not missed any Eliquis doses. Obtain labs and bilateral lower extremity DVT studies.  DVT study is negative.  BNP is negative.  He does not have pitting edema, rales or hypoxia so doubt CHF exacerbation. He still concerned over his pain and difficulty walking.  I discussed this at length at bedside with him.  He then states that this has been actually going on for months.  He states initially he would walk for some time and then began to have pain in his bilateral legs.  He states that now it starts to hurt even with walking from the bed to the door.  We discussed options for ABI studies although these may be low yield versus CT angio runoff study.  He is okay with CT angio.  I do not feel that he has an ischemic limb at this time.  Both of his lower extremities are warm and well-perfused with palpable DP pulses.  Will evaluate his ongoing symptoms with CT angio runoff and reevaluate afterward.  CT angio runoff shows that he does have a 2.5 cm occlusion at the right iliac artery at the origin with reconstitution. I consulted and spoke with Dr. Carlis Abbott, vascular surgeon who recommends following up in the outpatient setting.  Again, the patient has DP pulses bilaterally left greater than right.  The left is palpable, right is obtained by Doppler.  Both extremities are warm and dry without numbness or tingling, compartments are soft.  I discussed the findings with the patient.  Discussed that he will follow-up outpatient.  He is very agreeable with this plan.  Given return precautions for worsening symptoms or signs of ischemia.  Verbalized understanding.  The patient has been appropriately medically  screened and/or stabilized in the ED. I have low suspicion for any other emergent medical condition which would require further screening, evaluation or treatment in the ED or require inpatient management. At time of discharge the patient is hemodynamically stable and in no acute distress. I have discussed work-up results and diagnosis with patient and answered all questions. Patient is agreeable with discharge plan. We discussed strict return precautions for returning to the emergency department and they verbalized understanding.     Patient management required discussion with the following services or consulting groups:  None  Complexity of Problems Addressed Acute complicated illness or Injury  Additional Data Reviewed and Analyzed Further history obtained from: Past medical history and medications listed in the EMR, Prior ED visit notes, Care Everywhere, and Prior labs/imaging results  Patient Encounter Risk Assessment Prescriptions  Final Clinical Impression(s) / ED Diagnoses Final diagnoses:  Claudication [facility])    Rx / DC Orders ED Discharge Orders          Ordered    Ambulatory referral to Vascular Surgery        10/15/22 1820              Mickie Hillier, PA-C 10/15/22 1825    Horton, Alvin Critchley, DO 10/16/22 1532

## 2022-10-17 ENCOUNTER — Telehealth: Payer: Self-pay | Admitting: Vascular Surgery

## 2022-10-17 NOTE — Telephone Encounter (Signed)
-----   Message from Marty Heck, MD sent at 10/15/2022  6:09 PM EST ----- Can you get this patient an appointment with me in the office for right iliac occlusion and claudication?  Seen in the ED with CT scan.  ABI would be great.  Thanks,  Gerald Stabs

## 2022-10-17 NOTE — Telephone Encounter (Signed)
Referral attached to this Stf Msg

## 2022-10-20 NOTE — Telephone Encounter (Signed)
Appt has been scheduled.

## 2022-10-21 ENCOUNTER — Other Ambulatory Visit: Payer: Self-pay | Admitting: *Deleted

## 2022-10-21 DIAGNOSIS — I745 Embolism and thrombosis of iliac artery: Secondary | ICD-10-CM

## 2022-10-23 ENCOUNTER — Ambulatory Visit (HOSPITAL_COMMUNITY)
Admission: RE | Admit: 2022-10-23 | Discharge: 2022-10-23 | Disposition: A | Discharge status: Home / Self Care | Payer: Medicaid Other | Source: Ambulatory Visit | Attending: Vascular Surgery | Admitting: Vascular Surgery

## 2022-10-23 DIAGNOSIS — I745 Embolism and thrombosis of iliac artery: Secondary | ICD-10-CM | POA: Insufficient documentation

## 2022-10-23 LAB — VAS US ABI WITH/WO TBI
Left ABI: 1
Right ABI: 0.56

## 2022-10-28 ENCOUNTER — Encounter: Payer: Self-pay | Admitting: Vascular Surgery

## 2022-10-31 ENCOUNTER — Emergency Department (HOSPITAL_COMMUNITY): Payer: 59

## 2022-10-31 ENCOUNTER — Emergency Department (HOSPITAL_COMMUNITY)
Admission: EM | Admit: 2022-10-31 | Discharge: 2022-11-06 | Disposition: A | Discharge status: Home / Self Care | Payer: 59 | Attending: Emergency Medicine | Admitting: Emergency Medicine

## 2022-10-31 ENCOUNTER — Telehealth: Payer: Self-pay

## 2022-10-31 ENCOUNTER — Encounter (HOSPITAL_COMMUNITY): Payer: Self-pay | Admitting: *Deleted

## 2022-10-31 ENCOUNTER — Other Ambulatory Visit: Payer: Self-pay

## 2022-10-31 ENCOUNTER — Emergency Department (HOSPITAL_BASED_OUTPATIENT_CLINIC_OR_DEPARTMENT_OTHER)
Admit: 2022-10-31 | Discharge: 2022-10-31 | Disposition: A | Discharge status: Home / Self Care | Payer: 59 | Attending: Emergency Medicine | Admitting: Emergency Medicine

## 2022-10-31 DIAGNOSIS — M5126 Other intervertebral disc displacement, lumbar region: Secondary | ICD-10-CM | POA: Diagnosis not present

## 2022-10-31 DIAGNOSIS — M79605 Pain in left leg: Secondary | ICD-10-CM | POA: Diagnosis not present

## 2022-10-31 DIAGNOSIS — Z7901 Long term (current) use of anticoagulants: Secondary | ICD-10-CM | POA: Insufficient documentation

## 2022-10-31 DIAGNOSIS — Z87891 Personal history of nicotine dependence: Secondary | ICD-10-CM | POA: Insufficient documentation

## 2022-10-31 DIAGNOSIS — M79604 Pain in right leg: Secondary | ICD-10-CM | POA: Diagnosis not present

## 2022-10-31 DIAGNOSIS — I70213 Atherosclerosis of native arteries of extremities with intermittent claudication, bilateral legs: Secondary | ICD-10-CM | POA: Diagnosis not present

## 2022-10-31 DIAGNOSIS — R52 Pain, unspecified: Secondary | ICD-10-CM

## 2022-10-31 DIAGNOSIS — M5416 Radiculopathy, lumbar region: Secondary | ICD-10-CM

## 2022-10-31 DIAGNOSIS — I739 Peripheral vascular disease, unspecified: Secondary | ICD-10-CM

## 2022-10-31 HISTORY — DX: Radiculopathy, lumbar region: M54.16

## 2022-10-31 LAB — CBC
HCT: 42.9 % (ref 39.0–52.0)
Hemoglobin: 13.9 g/dL (ref 13.0–17.0)
MCH: 28.2 pg (ref 26.0–34.0)
MCHC: 32.4 g/dL (ref 30.0–36.0)
MCV: 87 fL (ref 80.0–100.0)
Platelets: 319 10*3/uL (ref 150–400)
RBC: 4.93 MIL/uL (ref 4.22–5.81)
RDW: 13 % (ref 11.5–15.5)
WBC: 6.3 10*3/uL (ref 4.0–10.5)
nRBC: 0 % (ref 0.0–0.2)

## 2022-10-31 LAB — BASIC METABOLIC PANEL
Anion gap: 9 (ref 5–15)
BUN: 14 mg/dL (ref 6–20)
CO2: 24 mmol/L (ref 22–32)
Calcium: 8.8 mg/dL — ABNORMAL LOW (ref 8.9–10.3)
Chloride: 105 mmol/L (ref 98–111)
Creatinine, Ser: 0.76 mg/dL (ref 0.61–1.24)
GFR, Estimated: >60 mL/min (ref >60)
Glucose, Bld: 104 mg/dL — ABNORMAL HIGH (ref 70–99)
Potassium: 3.9 mmol/L (ref 3.5–5.1)
Sodium: 138 mmol/L (ref 135–145)

## 2022-10-31 LAB — LACTIC ACID, PLASMA: Lactic Acid, Venous: 0.9 mmol/L (ref 0.5–1.9)

## 2022-10-31 MED ORDER — LORAZEPAM 2 MG/ML IJ SOLN: 1.0000 mg | Freq: Once | INTRAMUSCULAR | Status: DC

## 2022-10-31 MED ORDER — LORAZEPAM 2 MG/ML IJ SOLN
2.0000 mg | Freq: Once | INTRAMUSCULAR | Status: AC
  Administered 2022-10-31: 2 mg via INTRAVENOUS
  Filled 2022-10-31: qty 1

## 2022-10-31 NOTE — ED Provider Notes (Signed)
Care assumed from [place], PA-C at shift change. Please see their note for further information.  Briefly: Patient presents today with complaints of bilateral leg pain and weakness. States that same has been ongoing and worsening for the past several weeks. Was found to have a left sided DVT on 10/04/22 and was started on Elliquis. Then found to have right leg iliac artery occlusion on 2/14. Vascular surgery Dr. Carlis Abbott was consulted at that time and plan was for outpatient follow-up. He has had ABIs by vascular surgery on 2/22 shows moderate right lower extremity arterial disease. Left ABI normal. Has follow-up scheduled on Tuesday. States that he has been unable to walk for several weeks and is using crutches to get around. He does state that he has fallen several times recently. Denies any injuries. He is supposed to be on Elliquis, however states that he ran out 2 weeks ago. States he is able to put weight on his left leg but has to drag it behind him and is unable to put any weight on his right leg without it giving out. He can no longer make it to the bathroom across from his room. He lives with his elderly parents. He denies any back pain, trauma, numbness/tingling, loss of bowel or bladder function or saddle paresthesias.    Physical Exam  BP (!) 156/90   Pulse 89   Temp 98.3 F (36.8 C) (Oral)   Resp 18   Ht '5\' 9"'$  (1.753 m)   Wt (!) 170.1 kg   SpO2 99%   BMI 55.38 kg/m   Physical Exam Vitals and nursing note reviewed.  Constitutional:      General: He is not in acute distress.    Appearance: Normal appearance. He is normal weight. He is not ill-appearing, toxic-appearing or diaphoretic.  HENT:     Head: Normocephalic and atraumatic.  Cardiovascular:     Rate and Rhythm: Normal rate.  Pulmonary:     Effort: Pulmonary effort is normal. No respiratory distress.  Abdominal:     General: Abdomen is flat.     Palpations: Abdomen is soft.  Musculoskeletal:        General: Normal  range of motion.     Cervical back: Normal range of motion.     Comments: No tenderness to palpation of cervical, thoracic, or lumbar spine.  No step-offs, lesions, deformity, or overlying skin changes.  Patient is able to lift his right leg off the bed 4/5 strength noted. Unable to lift left leg off the bed with 1/5 strength. Reflexes intact and 2+. Sensation intact. DP and PT pulses 2+ in the left, palpable with doppler on the right.   5/5 strength and sensation intact to bilateral upper extremities.  Skin:    General: Skin is warm and dry.  Neurological:     General: No focal deficit present.     Mental Status: He is alert and oriented to person, place, and time.  Psychiatric:        Mood and Affect: Mood normal.        Behavior: Behavior normal.     Procedures  Procedures  ED Course / MDM    Medical Decision Making Amount and/or Complexity of Data Reviewed Labs: ordered. Radiology: ordered.  Risk Prescription drug management.   Plan: plan for patient to board here for PT/OT evaluation given his inability to ambulate.  Social work has seen the patient, reached out to me due to unclear plan for management of his inability  to ambulate. Chart reviewed and it appears that patient has an appointment with vascular surgery on Tuesday.   I discussed patient with vascular surgery Dr. Carlis Abbott who reviewed the patients chart and states that there is no vascular reason to explain his lack of mobility.  Given this discussion, I further evaluated the patient. He does have significant weakness in both legs with the left being worse than the right. He is able to move his upper extremities normally. Denies numbness/tingling. He does not have any back pain or TTP.  No saddle paresthesias or loss of bowel or bladder function to suggest cauda equina.  However, given patient's age and significant lack of mobility on exam, will obtain MRI for further evaluation.  MRI of the lumbar spine has been  obtained and resulted and reveals  1. No acute abnormality within the lumbar spine. 2. 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 3. Disc bulge with facet hypertrophy at L3-4 with resultant mild spinal stenosis, with mild bilateral L3 foraminal narrowing. 4. Disc bulge with facet hypertrophy at L5-S1 with resultant mild bilateral L5 foraminal stenosis. 5. Reactive marrow edema about the L4-5 facets bilaterally due to facet arthritis. Finding could serve as a source for lower back pain.   I have personally reviewed and interpreted this imaging and agree with radiology interpretation.  Given lack of significant findings on MRI and benign laboratory evaluation and ultrasound imaging, unclear etiology of patient's symptoms.  He is morbidly obese which is likely contributing.  Plan for patient to board overnight for PT OT evaluation in the morning.  Patient is understanding and amenable with plan.  He is medically cleared.  Findings and plan of care discussed with supervising physician Dr. Langston Masker who is in agreement.         Nestor Lewandowsky 10/31/22 2241    Wyvonnia Dusky, MD 10/31/22 2325

## 2022-10-31 NOTE — Progress Notes (Signed)
Bilateral lower extremity venous duplex has been completed. Preliminary results can be found in CV Proc through chart review.  Results were given to [place] PA.  10/31/22 3:04 PM Mitchell Johnson RVT

## 2022-10-31 NOTE — ED Provider Notes (Signed)
[facility] Provider Note   CSN: [id number] Arrival date & time: 10/31/22  1340     History  Chief Complaint  Patient presents with   Extremity Weakness    Mitchell Johnson is a 57 y.o. male history of hyperlipidemia, tobacco use who presents the emergency department complaining of bilateral leg pain and multiple falls for several weeks.  Reports that he is post to follow-up with the vascular surgeon in 4 days.  He lives at home with his elderly parents.  He continues to fall even while using crutches both due to the pain and the fact that he feels his legs are giving out.  Denies any pain anywhere else, numbness or tingling.  Says that the swelling and pain of his bilateral legs is getting worse, and he feels he cannot wait for surgery.  He is requesting to be admitted for help with his ADLs until surgery can be performed.   Extremity Weakness Pertinent negatives include no chest pain and no shortness of breath.       Home Medications Prior to Admission medications   Medication Sig Start Date End Date Taking? Authorizing Provider  apixaban (ELIQUIS) 5 MG TABS tablet Take 2 tablets (10 mg total) by mouth 2 (two) times daily for 7 days. Then '5mg'$  (1 tab) 2 (two) times daily for the rest of the month. 10/04/22 10/11/22  Rex Kras, PA  APIXABAN Arne [place]) VTE STARTER PACK ('10MG'$  AND '5MG'$ ) Take as directed on package: start with two-'5mg'$  tablets twice daily for 7 days. On day 8, switch to one-'5mg'$  tablet twice daily. 10/04/22   Isla Pence, MD  HYDROcodone-acetaminophen (NORCO/VICODIN) 5-325 MG tablet Take 2 tablets by mouth every 4 (four) hours as needed for up to 15 doses. 10/04/22   Isla Pence, MD      Allergies    Patient has no known allergies.    Review of Systems   Review of Systems  Respiratory:  Negative for shortness of breath.   Cardiovascular:  Positive for leg swelling. Negative for chest pain and palpitations.  Musculoskeletal:   Positive for extremity weakness. Negative for back pain and neck pain.  All other systems reviewed and are negative.   Physical Exam Updated Vital Signs BP (!) 155/102   Pulse 89   Temp 98.3 F (36.8 C) (Oral)   Resp 18   Ht '5\' 9"'$  (1.753 m)   Wt (!) 170.1 kg   SpO2 99%   BMI 55.38 kg/m  Physical Exam Vitals and nursing note reviewed.  Constitutional:      Appearance: Normal appearance. He is obese.  HENT:     Head: Normocephalic and atraumatic.  Eyes:     Conjunctiva/sclera: Conjunctivae normal.  Cardiovascular:     Rate and Rhythm: Normal rate and regular rhythm.     Pulses:          Dorsalis pedis pulses are detected w/ Doppler on the right side and detected w/ Doppler on the left side.     Comments: Bilateral nonpitting edema of the legs Pulmonary:     Effort: Pulmonary effort is normal. No respiratory distress.     Breath sounds: Normal breath sounds.  Abdominal:     General: There is no distension.     Palpations: Abdomen is soft.     Tenderness: There is no abdominal tenderness.  Skin:    General: Skin is warm and dry.     Capillary Refill: Capillary refill takes less  than 2 seconds.     Comments: Increased warmth of the L foot compared to the right, no overlying skin changes  Neurological:     General: No focal deficit present.     Mental Status: He is alert.     ED Results / Procedures / Treatments   Labs (all labs ordered are listed, but only abnormal results are displayed) Labs Reviewed  BASIC METABOLIC PANEL - Abnormal; Notable for the following components:      Result Value   Glucose, Bld 104 (*)    Calcium 8.8 (*)    All other components within normal limits  CBC  LACTIC ACID, PLASMA    EKG None  Radiology VAS [place] LOWER EXTREMITY VENOUS (DVT) (7a-7p)  Result Date: 10/31/2022  Lower Venous DVT Study Patient Name:  Mitchell Johnson  Date of Exam:   10/31/2022 Medical Rec #: [id number]        Accession #:    [id number] Date of Birth: 03-22-1966         Patient Gender: M Patient Age:   49 years Exam Location:  [facility] Procedure:      VAS [place] LOWER EXTREMITY VENOUS (DVT) Referring Phys: Audrie Gallus Jemarcus Dougal --------------------------------------------------------------------------------  Indications: Pain.  Risk Factors: DVT. Limitations: Body habitus and poor ultrasound/tissue interface. Comparison Study: 10/04/2022 - RIGHT:                   - No evidence of common femoral vein obstruction.                    LEFT:                   - Findings consistent with age indeterminate deep vein                   thrombosis                   involving the left gastrocnemius veins.                    - No cystic structure found in the popliteal fossa.                    10/15/2022 - BILATERAL:                   - No evidence of deep vein thrombosis seen in the lower                   extremities,                   bilaterally.                   -No evidence of popliteal cyst, bilaterally. Performing Technologist: Oliver Hum RVT  Examination Guidelines: A complete evaluation includes B-mode imaging, spectral Doppler, color Doppler, and power Doppler as needed of all accessible portions of each vessel. Bilateral testing is considered an integral part of a complete examination. Limited examinations for reoccurring indications may be performed as noted. The reflux portion of the exam is performed with the patient in reverse Trendelenburg.  +---------+---------------+---------+-----------+----------+--------------+ RIGHT    CompressibilityPhasicitySpontaneityPropertiesThrombus Aging +---------+---------------+---------+-----------+----------+--------------+ CFV      Full           Yes      Yes                                 +---------+---------------+---------+-----------+----------+--------------+  SFJ      Full                                                        +---------+---------------+---------+-----------+----------+--------------+ FV  Prox  Full                                                        +---------+---------------+---------+-----------+----------+--------------+ FV Mid   Full                                                        +---------+---------------+---------+-----------+----------+--------------+ FV DistalFull                                                        +---------+---------------+---------+-----------+----------+--------------+ PFV      Full                                                        +---------+---------------+---------+-----------+----------+--------------+ POP      Full           Yes      Yes                                 +---------+---------------+---------+-----------+----------+--------------+ PTV      Full                                                        +---------+---------------+---------+-----------+----------+--------------+ PERO     Full                                                        +---------+---------------+---------+-----------+----------+--------------+   +---------+---------------+---------+-----------+----------+--------------+ LEFT     CompressibilityPhasicitySpontaneityPropertiesThrombus Aging +---------+---------------+---------+-----------+----------+--------------+ CFV      Full           Yes      Yes                                 +---------+---------------+---------+-----------+----------+--------------+ SFJ      Full                                                        +---------+---------------+---------+-----------+----------+--------------+  FV Prox  Full                                                        +---------+---------------+---------+-----------+----------+--------------+ FV Mid   Full                                                        +---------+---------------+---------+-----------+----------+--------------+ FV DistalFull           Yes      Yes                                  +---------+---------------+---------+-----------+----------+--------------+ PFV      Full                                                        +---------+---------------+---------+-----------+----------+--------------+ POP      Full           Yes      Yes                                 +---------+---------------+---------+-----------+----------+--------------+ PTV      Full                                                        +---------+---------------+---------+-----------+----------+--------------+ PERO     Full                                                        +---------+---------------+---------+-----------+----------+--------------+ Gastroc  Full                                                        +---------+---------------+---------+-----------+----------+--------------+     Summary: RIGHT: - There is no evidence of deep vein thrombosis in the lower extremity.  - No cystic structure found in the popliteal fossa.  LEFT: - There is no evidence of deep vein thrombosis in the lower extremity.  - No cystic structure found in the popliteal fossa.  *See table(s) above for measurements and observations.    Preliminary     Procedures Procedures    Medications Ordered in ED Medications - No data to display  ED Course/ Medical Decision Making/ A&P                             Medical Decision  Making Amount and/or Complexity of Data Reviewed Labs: ordered.  This patient is a 57 y.o. male  who presents to the ED for concern of bilateral leg pain and swelling.   Differential diagnoses prior to evaluation: The emergent differential diagnosis includes, but is not limited to,  cellulitis, DVT, PVD, acute arterial occlusion, CHF. This is not an exhaustive differential.   Past Medical History / Co-morbidities: hyperlipidemia, tobacco use  Additional history: Chart reviewed. Pertinent results include: Original ER visit for same  symptoms on 2/3.  Had DVT study performed at that time that showed DVT of the left gastrocnemius vein.  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 Follow-up ABI per him by vascular surgery on 2/22 shows moderate right lower extremity arterial disease.  Left ABI normal.  Physical Exam: Physical exam performed. The pertinent findings include: Hypertensive and mildly tachycardic.  Laying in exam bed in no acute distress.  Bilateral nonpitting edema to the legs.  Increased warmth of the left foot compared to the right, no overlying skin changes, not cellulitic.  Pulses dopplerable bilaterally.  Lab Tests/Imaging studies: I personally interpreted labs/imaging and the pertinent results include: CBC and BMP unremarkable.  Normal lactic acid..  Bilateral DVT ultrasounds with no evidence of DVT.  I agree with the radiologist interpretation.  Disposition: After consideration of the diagnostic results and the patients response to treatment, I feel that emergency department workup does not suggest an emergent condition requiring admission or immediate intervention beyond what has been performed at this time. However, the patient is unable to walk without falling due to pain from claudication due to peripheral vascular disease. No acute etiology found for symptoms today. Patient will require SW/TOC consultation for home health vs SNF placement. Consultations to PT and OT placed.   Patient discussed and care transferred to [place] PA-C at shift change. Do not anticipate requiring further emergent evaluation or treatment at this time. Plan at time of handoff is patient to board in ER for TOC, SW, PT, and OT evaluations.   Final Clinical Impression(s) / ED Diagnoses Final diagnoses:  Claudication ([place])  Bilateral leg pain    Rx / DC Orders ED Discharge Orders     None      Portions of this report may  have been transcribed using voice recognition software. Every effort was made to ensure accuracy; however, inadvertent computerized transcription errors may be present.    Estill Cotta 10/31/22 1522    Cristie Hem, MD 11/08/22 [phone]

## 2022-10-31 NOTE — ED Notes (Signed)
Patient returned from MRI, now in hospital bed as well for comfort per request. Patient resting in bed, no distress noted.

## 2022-10-31 NOTE — ED Triage Notes (Signed)
BIB EMS with multiple falls and leg pain more in left.  recently seen for r/o DVT's pt now falling frequently. 170/80-96-96% RA

## 2022-10-31 NOTE — Telephone Encounter (Signed)
Pt called requesting return call.  Reviewed pt's chart, returned call for clarification, two identifiers used. Pt stated that he has an appt with Dr. Carlis Abbott on Tuesday, 3/5, but he is having severe BLE pain. He can barely ambulate and fell several times last HS. He doesn't feel that he can wait and is concerned that if he continues to fall, he will either be injured or be unable to get up. He is scared to drive. Pt encouraged to seek emergent help. Pt calling EMS to transport him to ED. Confirmed understanding.

## 2022-10-31 NOTE — Progress Notes (Signed)
Transition of Care [facility]) - Emergency Department Mini Assessment   Patient Details  Name: Mitchell Johnson MRN: [id number] Date of Birth: Nov 08, 1965  Transition of Care [facility]) CM/SW Contact:    Rodney Booze, LCSW Phone Number: 10/31/2022, 4:37 PM   Clinical Narrative: CSW spoke to the patient at the bedside, the patient was asked about [place]. The patient went on to say that he can not walk, if he needs to get to the bathroom at 2 am how would he do so. The patient asked if the provider can reach out to the surgeon to inquire about his legs. This CSW spoke to the PA to discuss possible options for this patient. The PA will contact the surgeon as well put a PT OT order in. The patient stated he can not go home in this condition as he is falling and hit his head.   ED Mini Assessment: What brought you to the Emergency Department? : (P) fall     Barrier interventions: (P) patient is falling at home.  Means of departure: (P) Not know       Patient Contact and Communications        ,          Patient states their goals for this hospitalization and ongoing recovery are:: (P) Patient would like to be admitted, Patient can not walk.      Admission diagnosis:  Leg Pain, weakness There are no problems to display for this patient.  PCP:  Patient, No Pcp Per Pharmacy:   [place] ([place]), [place] - [address] [place] ([place]) [place] [address] Phone: [phone] Fax: [place] [address] ([address]), [place] [phone] [place] ([place]) [place] [address] Phone: [phone] Fax: [phone]  [place], [place] - [address] [address] [place] [address] Phone: [phone] Fax: [phone]

## 2022-11-01 DIAGNOSIS — M79605 Pain in left leg: Secondary | ICD-10-CM | POA: Diagnosis not present

## 2022-11-01 MED ORDER — HYDROCODONE-ACETAMINOPHEN 5-325 MG PO TABS
1.0000 | ORAL_TABLET | Freq: Four times a day (QID) | ORAL | Status: AC | PRN
  Administered 2022-11-01 – 2022-11-04 (×5): 1 via ORAL
  Filled 2022-11-01 (×5): qty 1

## 2022-11-01 MED ORDER — CALCIUM CARBONATE ANTACID 500 MG PO CHEW
1.0000 | CHEWABLE_TABLET | Freq: Three times a day (TID) | ORAL | Status: DC | PRN
  Administered 2022-11-01 – 2022-11-02 (×2): 200 mg via ORAL
  Filled 2022-11-01 (×2): qty 1

## 2022-11-01 NOTE — ED Notes (Signed)
PT and OT assessing pt now

## 2022-11-01 NOTE — NC FL2 (Signed)
  [place] LEVEL OF CARE FORM     IDENTIFICATION  Patient Name: Mitchell Johnson Birthdate: Jan 05, 1966 Sex: male Admission Date (Current Location): 10/31/2022  [facility] and [place] Number:  Herbalist and Address:  [facility],  [place] [address] [place]      Provider Number: [id number]  Attending Physician Name and Address:  Default, Provider, MD  Relative Name and Phone Number:       Current Level of Care: Hospital Recommended Level of Care: [place] Prior Approval Number:    Date Approved/Denied:   PASRR Number: [id number]  Discharge Plan: SNF    Current Diagnoses: There are no problems to display for this patient.   Orientation RESPIRATION BLADDER Height & Weight     Self, Time, Situation, Place  Normal Continent Weight: (!) 375 lb (170.1 kg) Height:  '5\' 9"'$  (175.3 cm)  BEHAVIORAL SYMPTOMS/MOOD NEUROLOGICAL BOWEL NUTRITION STATUS      Continent Diet (regular)  AMBULATORY STATUS COMMUNICATION OF NEEDS Skin   Extensive Assist Verbally Normal                       Personal Care Assistance Level of Assistance  Bathing, Feeding, Dressing Bathing Assistance: Maximum assistance Feeding assistance: Independent Dressing Assistance: Limited assistance     Functional Limitations Info  Sight, Hearing, Speech Sight Info: Adequate Hearing Info: Adequate Speech Info: Adequate    SPECIAL CARE FACTORS FREQUENCY  PT (By licensed PT), OT (By licensed OT)     PT Frequency: 5 x a week OT Frequency: 5 x a week            Contractures Contractures Info: Not present    Additional Factors Info  Code Status Code Status Info: full             Current Medications (11/01/2022):  This is the current hospital active medication list No current facility-administered medications for this encounter.   Current Outpatient Medications  Medication Sig Dispense Refill   ALEVE 220 MG tablet Take 1,100 mg by  mouth daily as needed (for pain).     apixaban (ELIQUIS) 5 MG TABS tablet Take 2 tablets (10 mg total) by mouth 2 (two) times daily for 7 days. Then '5mg'$  (1 tab) 2 (two) times daily for the rest of the month. (Patient not taking: Reported on 10/31/2022) 74 tablet 0   APIXABAN (ELIQUIS) VTE STARTER PACK ('10MG'$  AND '5MG'$ ) Take as directed on package: start with two-'5mg'$  tablets twice daily for 7 days. On day 8, switch to one-'5mg'$  tablet twice daily. (Patient not taking: Reported on 10/31/2022) 1 each 0   HYDROcodone-acetaminophen (NORCO/VICODIN) 5-325 MG tablet Take 2 tablets by mouth every 4 (four) hours as needed for up to 15 doses. (Patient not taking: Reported on 10/31/2022) 15 tablet 0     Discharge Medications: Please see discharge summary for a list of discharge medications.  Relevant Imaging Results:  Relevant Lab Results:   Additional Information [id number]  [place], LCSW

## 2022-11-01 NOTE — Evaluation (Signed)
Physical Therapy Evaluation Patient Details Name: Mitchell Johnson MRN: [id number] DOB: June 26, 1966 Today's Date: 11/01/2022     Clinical Impression   Patient is a 57 year old male presenting with the problems and impairments listed below. 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 Patient currently is unable to tolerate LLE weightbearing in standing despite use of bariatric RW with significant pain involved, pt only able to place foot on the floor. Patient noted to have increased warmth, erythema, and edema on LLE as well compared to RLE. RN made aware and to let MD know. Pt required min assist for bed mobility, min guard for transfers, and min assist +2 for very short-distance ambulation forward and backward at edge of bed, distance limited by pain and fatigue. Discussed SNF-level therapies upon discharge and pt is agreeable. We will continue to follow acutely.       11/01/22 1100  PT Visit Information  Last PT Received On 11/01/22  Assistance Needed +2  Reason for Co-Treatment For patient/therapist safety;To address functional/ADL transfers  PT goals addressed during session Mobility/safety with mobility  OT goals addressed during session ADL's and self-care  History of Present Illness Patient is a 57 year old male presenting to [facility] ED on 3/1 complaining of multiple falls with LLE pain, recently seen 2/3 for L-DVT. MRI showed disc bulge with facet hypertrophy at L3-4 with spinal stenosis and at L5-S1. PMH: HLD, tobacco use  Precautions  Precautions Fall  Restrictions  Weight Bearing Restrictions No  Home Living  Family/patient expects to be discharged to: Private residence  Living Arrangements Parent  Available Help at Discharge Family;Available PRN/intermittently  Type of Home House  Home Access Stairs to enter  Entrance  Stairs-Number of Steps 2  Entrance Stairs-Rails None  Home Layout One level  Financial planner Crutches  Prior Function  Prior Level of Function  Independent/Modified Independent;History of Falls (last six months);Working/employed;Driving (works as Administrator)  Mobility Comments Utilizes crutches, many recent falls "my leg gives out"  ADLs Comments patient is a long distance truck driver prior to hosptialization; sponge bathes at baseline  Communication  Communication No difficulties  Pain Assessment  Pain Assessment 0-10  Pain Score 10  Pain Location LLE  Pain Descriptors / Indicators Discomfort;Grimacing;Moaning;Guarding  Pain Intervention(s) Limited activity within patient's tolerance;Monitored during session;Repositioned  Cognition  Arousal/Alertness Awake/alert  Behavior During Therapy WFL for tasks assessed/performed  Overall Cognitive Status Within Functional Limits for tasks assessed  Upper Extremity Assessment  Upper Extremity Assessment Defer to OT evaluation  Lower Extremity Assessment  Lower Extremity Assessment LLE deficits/detail;RLE deficits/detail  RLE Deficits / Details Some swelling noted, ROM limited by effusion  RLE Sensation WNL  LLE Deficits / Details Edema, erythema, and pain noticeably more L>R, pt localized all pain to LLE  LLE Sensation WNL  Cervical / Trunk Assessment  Cervical / Trunk Assessment Other exceptions  Cervical / Trunk Exceptions body habitus  Bed Mobility  Overal bed mobility Needs Assistance  Bed Mobility Supine to Sit;Sit to Supine  Supine to sit Min assist  Sit to supine Min assist  General bed mobility comments Min assist (lightly) to bring LLE off and back on bed.  Transfers  Overall transfer level Needs assistance  Equipment used Rolling walker (2 wheels)  Transfers Sit to/from Stand  Sit to  Stand Min guard;+2 safety/equipment;From elevated surface  General transfer comment For safety only, pt  able to complete transfer using bariatic RW and elevated hospital bed.  Ambulation/Gait  Ambulation/Gait assistance Min assist;+2 safety/equipment  Gait Distance (Feet) 3 Feet  Assistive device Rolling walker (2 wheels)  Gait Pattern/deviations Step-to pattern;Decreased stance time - left;Decreased step length - left;Decreased dorsiflexion - left;Decreased weight shift to left;Knee flexed in stance - left  General Gait Details Pt utilized "hopping" method for forward and retro ambulation with HEAVY BUE support on bariatric RW, pt able to place LLE flat on the floor but gingerly with fear of weightbearing. Min assist for steadying, distance limited by fatigue "my leg is going to give out on me." further mobility deferred  Gait velocity decreased  Balance  Overall balance assessment Needs assistance  Sitting-balance support Feet supported  Sitting balance-Leahy Scale Good  Standing balance support Reliant on assistive device for balance;Bilateral upper extremity supported  Standing balance-Leahy Scale Poor  Standing balance comment unable to keep LLE on ground  General Comments  General comments (skin integrity, edema, etc.) RN notified of LLE warmth, swelling, and redness  PT - End of Session  Equipment Utilized During Treatment Gait belt  Activity Tolerance Patient limited by pain;Patient limited by fatigue  Patient left in bed;with call bell/phone within reach  Nurse Communication Mobility status  PT Assessment  PT Recommendation/Assessment Patient needs continued PT services  PT Visit Diagnosis History of falling (Z91.81);Other abnormalities of gait and mobility (R26.89)  PT Problem List Decreased strength;Decreased range of motion;Decreased activity tolerance;Decreased balance;Decreased mobility;Pain  PT Plan  PT Frequency (ACUTE ONLY) Min 2X/week  PT Treatment/Interventions (ACUTE ONLY) DME instruction;Gait training;Stair training;Functional mobility training;Therapeutic  activities;Therapeutic exercise;Balance training;Neuromuscular re-education;Patient/family education;Wheelchair mobility training  AM-PAC PT "6 Clicks" Mobility Outcome Measure (Version 2)  Help needed turning from your back to your side while in a flat bed without using bedrails? 3  Help needed moving from lying on your back to sitting on the side of a flat bed without using bedrails? 3  Help needed moving to and from a bed to a chair (including a wheelchair)? 3  Help needed standing up from a chair using your arms (e.g., wheelchair or bedside chair)? 3  Help needed to walk in hospital room? 3  Help needed climbing 3-5 steps with a railing?  1  6 Click Score 16  Consider Recommendation of Discharge To: Home with [facility]  Progressive Mobility  What is the highest level of mobility based on the progressive mobility assessment? Level 3 (Stands with assist) - Balance while standing  and cannot march in place  Activity Stood at bedside;Ambulated with assistance in room  PT Recommendation  Follow Up Recommendations Skilled nursing-short term rehab (<3 hours/day)  Can patient physically be transported by private vehicle Yes  Assistance recommended at discharge Intermittent Supervision/Assistance  Patient can return home with the following Help with stairs or ramp for entrance;Assist for transportation;Assistance with cooking/housework;A little help with bathing/dressing/bathroom;A little help with walking and/or transfers  Functional Status Assessment Patient has had a recent decline in their functional status and demonstrates the ability to make significant improvements in function in a reasonable and predictable amount of time.  PT equipment None recommended by PT (TBD at SNF)  Individuals Consulted  Consulted and Agree with Results and Recommendations Patient  Acute Rehab PT Goals  Patient Stated Goal To stop falling and reduce pain  PT Goal Formulation With patient  Time For Goal Achievement  11/15/22  Potential to Achieve Goals Good  PT Time Calculation  PT Start Time (ACUTE ONLY) 0930  PT Stop Time (ACUTE ONLY) 1001  PT Time Calculation (min) (ACUTE ONLY) 31 min  PT General Charges  $$ ACUTE PT VISIT 1 Visit  PT Evaluation  $PT Eval Low Complexity 1 Low  PT Treatments  $Therapeutic Activity 8-22 mins  Written Expression  Dominant Hand Right   Coolidge Breeze, PT, DPT [facility] Rehabilitation Department Office: [phone] Weekend pager: [phone]

## 2022-11-01 NOTE — ED Notes (Signed)
Pt complaining of feeling "gassy" and "uneasy feeling in stomach", but denied nausea. Provider aware and order placed for antiacid

## 2022-11-01 NOTE — Evaluation (Signed)
Occupational Therapy Evaluation Patient Details Name: Mitchell Johnson MRN: [id number] DOB: [birth year] Today's Date: 11/01/2022   History of Present Illness Patient is a 57 year old male presenting to [facility] ED on 3/1 complaining of multiple falls with LLE pain, recently seen 2/3 for L-DVT. MRI showed disc bulge with facet hypertrophy at L3-4 with spinal stenosis and at L5-S1. PMH: HLD, tobacco use   Clinical Impression   Patient is a 57 year old male who was admitted for above. 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 Patient currently, is unable to tolerate LLE touching ground in standing with significant pain involved. Patient noted to have increased heat on LLE as well compared to RLE. Nurse made aware and to let MD know. Patient was noted to have decreased functional activity tolerance, decreased endurance, decreased standing balance, decreased safety awareness, and decreased knowledge of AD/AE impacting participation in ADLs. Patient would continue to benefit from skilled OT services at this time while admitted and after d/c to address noted deficits in order to improve overall safety and independence in ADLs.        Recommendations for follow up therapy are one component of a multi-disciplinary discharge planning process, led by the attending physician.  Recommendations may be updated based on patient status, additional functional criteria and insurance authorization.   Follow Up Recommendations  Skilled nursing-short term rehab (<3 hours/day)     Assistance Recommended at Discharge Frequent or constant Supervision/Assistance  Patient can return home with the following Two people to help with walking and/or transfers;A lot of help with bathing/dressing/bathroom;Assistance with cooking/housework;Direct supervision/assist for medications management;Assist for transportation;Help with stairs or ramp for entrance;Direct supervision/assist for  financial management    Functional Status Assessment  Patient has had a recent decline in their functional status and demonstrates the ability to make significant improvements in function in a reasonable and predictable amount of time.  Equipment Recommendations  Wheelchair cushion (measurements OT);Wheelchair (measurements OT) (bariatric drop arm BSC)       Precautions / Restrictions Precautions Precautions: Fall Restrictions Weight Bearing Restrictions: No      Mobility Bed Mobility Overal bed mobility: Needs Assistance Bed Mobility: Supine to Sit, Sit to Supine     Supine to sit: Min assist Sit to supine: Min assist             Balance Overall balance assessment: Needs assistance Sitting-balance support: Feet supported Sitting balance-Leahy Scale: Good     Standing balance support: Reliant on assistive device for balance, Bilateral upper extremity supported Standing balance-Leahy Scale: Poor Standing balance comment: unable to keep LLE on ground         ADL either performed or assessed with clinical judgement   ADL Overall ADL's : Needs assistance/impaired Eating/Feeding: Modified independent;Sitting   Grooming: Wash/dry face;Oral care;Set up;Sitting Grooming Details (indicate cue type and reason): EOB Upper Body Bathing: Set up;Sitting   Lower Body Bathing: Moderate assistance;Sitting/lateral leans;Sit to/from stand Lower Body Bathing Details (indicate cue type and reason): able to bring LEs up onto edge of bed to attempt LB dressing/bathing tasks. Upper Body Dressing : Set up;Sitting   Lower Body Dressing: Moderate assistance Lower Body Dressing Details (indicate cue type and reason): able to don sock on RLE but needed A to don on LLE with increased pain. patien reported it feels like gout pain. Toilet Transfer: Moderate assistance;+2 for safety/equipment;+2 for physical assistance;Rolling walker (2 wheels) Toilet Transfer Details (indicate cue type and  reason): patient was able to take a few hop steps but increased pain and inability to keep LLE on ground with attempts to move Toileting- Clothing Manipulation and Hygiene: Sit to/from stand;Total assistance               Vision Patient Visual Report: No change from baseline              Pertinent Vitals/Pain Pain Assessment Pain Assessment: 0-10 Pain Score: 10-Worst pain ever Pain Location: LLE Pain Descriptors / Indicators: Discomfort, Grimacing, Moaning, Guarding Pain Intervention(s): Limited activity within patient's tolerance, Premedicated before session, Monitored during session, Repositioned     Hand Dominance Right   Extremity/Trunk Assessment Upper Extremity Assessment Upper Extremity Assessment: Overall WFL for tasks assessed   Lower Extremity Assessment Lower Extremity Assessment: Defer to PT evaluation   Cervical / Trunk Assessment Cervical / Trunk Assessment: Other exceptions Cervical / Trunk Exceptions: body habitus   Communication Communication Communication: No difficulties   Cognition Arousal/Alertness: Awake/alert Behavior During Therapy: WFL for tasks assessed/performed Overall Cognitive Status: Within Functional Limits for tasks assessed                              Home Living Family/patient expects to be discharged to:: Private residence Living Arrangements: Parent Available Help at Discharge: Family;Available PRN/intermittently Type of Home: House Home Access: Stairs to enter           [employer] Shower/Tub: Walk-in shower         Home Equipment: Crutches          Prior Functioning/Environment Prior Level of Function : Independent/Modified Independent               ADLs Comments: patient is a long distance truck driver prior to hosptialization        OT Problem List: Decreased strength;Decreased activity tolerance;Impaired balance (sitting and/or standing);Decreased coordination;Decreased safety  awareness;Decreased knowledge of precautions;Pain;Obesity;Decreased knowledge of use of DME or AE      OT Treatment/Interventions: Self-care/ADL training;Energy conservation;Therapeutic exercise;DME and/or AE instruction;Therapeutic activities;Patient/family education;Balance training    OT Goals(Current goals can be found in the care plan section) Acute Rehab OT Goals Patient Stated Goal: to get pain under control OT Goal Formulation: With patient Time For Goal Achievement: 11/15/22 Potential to Achieve Goals: Fair  OT Frequency: Min 2X/week    Co-evaluation PT/OT/SLP Co-Evaluation/Treatment: Yes Reason for Co-Treatment: For patient/therapist safety;To address functional/ADL transfers PT goals addressed during session: Mobility/safety with mobility OT goals addressed during session: ADL's and self-care      AM-PAC OT "6 Clicks" Daily Activity     Outcome Measure Help from another person eating meals?: A Little Help from another person taking care of personal grooming?: A Little Help from another person toileting, which includes using toliet, bedpan, or urinal?: A Lot Help from another person bathing (including washing, rinsing, drying)?: A Lot Help from another person to put on and taking off regular upper body clothing?: A Little Help from another person to put on and taking off regular lower body clothing?: A Lot 6 Click Score: 15   End of Session Equipment Utilized During Treatment: Gait belt;Rolling walker (2 wheels) Nurse Communication: Other (comment) (ok to participate)  Activity Tolerance: Patient limited by pain Patient left: in bed;with call bell/phone within reach  OT Visit Diagnosis: Unsteadiness on feet (R26.81);Other abnormalities of gait and mobility (R26.89);Muscle weakness (generalized) (M62.81);Pain Pain - Right/Left: Left Pain - part of body: Leg  Time: 0931-1000 OT Time Calculation (min): 29 min Charges:  OT General Charges $OT Visit: 1  Visit OT Evaluation $OT Eval Moderate Complexity: 1 Mod  Blaire Hodsdon OTR/L, MS Acute Rehabilitation Department Office# [phone]   Willa Rough 11/01/2022, 10:58 AM

## 2022-11-01 NOTE — ED Notes (Signed)
Pt has loaner walker at bedside, able to ambulate to toilet with standby assist.

## 2022-11-01 NOTE — Progress Notes (Signed)
SNF beds pending. TOC to follow.   Arlie Solomons.Jeilyn Reznik, MSW, [place]  Transitions of Care Clinical Social Worker I Direct Dial: [phone]  Fax: [phone] [email or web address]

## 2022-11-01 NOTE — Progress Notes (Signed)
CSW spoke with patient about recommendations for SNF. paitent has agreed to placement. CSW explained the process, and informed him about barriers to placement. CSW to work pt up for SNF placement. TOC to follow.    Mitchell Johnson.Mitchell Johnson, MSW, [place]  Transitions of Care Clinical Social Worker I Direct Dial: [phone]  Fax: [phone] [email or web address]

## 2022-11-02 DIAGNOSIS — M79605 Pain in left leg: Secondary | ICD-10-CM | POA: Diagnosis not present

## 2022-11-02 NOTE — ED Provider Notes (Addendum)
Emergency Medicine Observation Re-evaluation Note  Mitchell Johnson is a 57 y.o. male, seen on rounds today.  Pt initially presented to the ED for complaints of Extremity Weakness Currently, the patient is resting and in no acute distress.  Physical Exam  BP (!) 145/90   Pulse 88   Temp 98 F (36.7 C) (Oral)   Resp 17   Ht 1.753 m ('5\' 9"'$ )   Wt (!) 170.1 kg   SpO2 100%   BMI 55.38 kg/m  Physical Exam General: morbidly obese male  Cardiac: rrr Lungs: no distress Psych: awke and alert ,no si Rle-faintly palpable dp pulses Left dp pulses good No evidence of acute ischemia at this time, but patient with known  limb ischemia   ED Course / MDM  EKG:   I have reviewed the labs performed to date as well as medications administered while in observation.  Recent changes in the last 24 hours include patient stable.  Plan  Current plan is for awaiting placement  Patient with report of vascular surgery appointment on Tuesday    Pattricia Boss, MD 11/02/22 1433    Pattricia Boss, MD 11/02/22 1435

## 2022-11-02 NOTE — ED Notes (Signed)
Pt assisted with walker to restroom and back to bed. Pt's linens were changed per pt request

## 2022-11-02 NOTE — ED Provider Notes (Signed)
Emergency Medicine Observation Re-evaluation Note  Mitchell Johnson is a 57 y.o. male, seen on rounds today.  Pt initially presented to the ED for complaints of Extremity Weakness Currently, the patient is resting comfortably.  Asked by nursing to evaluate patient secondary to request for pain medicine.  Patient complains of chronic pain to bilateral lower extremities.  History and exam are suggestive of chronic pain primarily in the knees.  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  Presentation/prior workup not consistent with claudication.  Imaging without evidence of acute DVT.  MRI imaging without acute pathology.  Placement pending.  Physical Exam  BP (!) 161/103   Pulse (!) 101   Temp 98.5 F (36.9 C) (Oral)   Resp 18   Ht '5\' 9"'$  (1.753 m)   Wt (!) 170.1 kg   SpO2 100%   BMI 55.38 kg/m  Physical Exam General: NAD   ED Course / MDM  EKG:   I have reviewed the labs performed to date as well as medications administered while in observation.  Recent changes in the last 24 hours include NAD.  Plan  Current plan is for his work evaluation and placement.  Patient reports prior adequate pain control with Norco.  Will provide for same here in the ED.Marland Kitchen    Valarie Merino, MD 11/02/22 [phone]

## 2022-11-02 NOTE — Progress Notes (Addendum)
Attempted to contact [facility] with [facility] Int[facility] regarding bed offers. Left HIPAA Compliant voicemail. TOC anticipates possible delay in progression due to it being the weekend.   Addend @ 11:58 AM Presented bed offers to pt. Pt has accepted [facility] at this time. This CSW will attempt to reach back out to [facility] with Central Intake. Left a HIPAA Compliant voicemail for [facility].

## 2022-11-02 NOTE — ED Notes (Signed)
Pt complaining of leg pain and requesting pain medication. PRN meds ordered and will be administered

## 2022-11-03 DIAGNOSIS — M79605 Pain in left leg: Secondary | ICD-10-CM | POA: Diagnosis not present

## 2022-11-03 NOTE — Progress Notes (Addendum)
[facility] with central intake reports the pt can admit with insurance Auth. SNF to initiate Auth. TOC following.   Addend @ 11:56 AM This CSW received notification from [place] with Central Intake that the pt's insurance expired 2/29. Pt states he applied for [employer] in February and it should have went into effect 3/1. Pt to contact [employer] to determine insurance. Informed pt that if he does not have an active insurance plan, he would have to d/c home due to there not being a payor source for SNF.   Addend @ 12:50 PM Contacted [facility] registration to request that [place] is added in as insurance. Provided Whitney @ Central Intake with [place] information so SNF can initiate Auth due to showing [employer] is inactive.   Addend @ 3:02 PM Auth is still pending.

## 2022-11-03 NOTE — ED Provider Notes (Signed)
Emergency Medicine Observation Re-evaluation Note  Mitchell Johnson is a 57 y.o. male, seen on rounds today.  Pt initially presented to the ED for complaints of Extremity Weakness Currently, the patient is resting.  Physical Exam  BP (!) 148/88   Pulse 82   Temp 97.9 F (36.6 C) (Oral)   Resp 16   Ht 1.753 m ('5\' 9"'$ )   Wt (!) 170.1 kg   SpO2 100%   BMI 55.38 kg/m  Physical Exam General: nad Cardiac: normal rate   ED Course / MDM  EKG:   I have reviewed the labs performed to date as well as medications administered while in observation.  Recent changes in the last 24 hours include no events.  Plan  Current plan is for TOC placement.    Dorie Rank, MD 11/03/22 [phone]

## 2022-11-03 NOTE — Group Note (Unsigned)
Date:  11/03/2022 Time:  10:23 AM  Group Topic/Focus:  Goals Group:   The focus of this group is to help patients establish daily goals to achieve during treatment and discuss how the patient can incorporate goal setting into their daily lives to aide in recovery. Orientation:   The focus of this group is to educate the patient on the purpose and policies of crisis stabilization and provide a format to answer questions about their admission.  The group details unit policies and expectations of patients while admitted.     Participation Level:  {BHH PARTICIPATION HD:[id number]  Participation Quality:  {BHH PARTICIPATION QUALITY:22265}  Affect:  {BHH AFFECT:22266}  Cognitive:  {BHH COGNITIVE:22267}  Insight: {BHH Insight2:20797}  Engagement in Group:  {BHH ENGAGEMENT IN JY:[id number]  Modes of Intervention:  {BHH MODES OF INTERVENTION:22269}  Additional Comments:  ***  Dub Mikes 11/03/2022, 10:23 AM

## 2022-11-03 NOTE — Progress Notes (Signed)
Occupational Therapy Treatment Patient Details Name: Mitchell Johnson MRN: [id number] DOB: 1966/01/23 Today's Date: 11/03/2022   History of present illness Patient is a 57 year old male presenting to [facility] ED on 3/1 complaining of multiple falls with LLE pain, recently seen 2/3 for L-DVT. MRI showed disc bulge with facet hypertrophy at L3-4 with spinal stenosis and at L5-S1. PMH: HLD, tobacco use   OT comments  Patient was noted to have made some progress with bed mobility and ability to participate in hopping today. Patient remains with increased pain in LLE with touching it to the floor. Patient session limited with patient on phone with case manager regarding insurance issue at this time. Patient's discharge plan remains appropriate at this time. OT will continue to follow acutely.     Recommendations for follow up therapy are one component of a multi-disciplinary discharge planning process, led by the attending physician.  Recommendations may be updated based on patient status, additional functional criteria and insurance authorization.    Follow Up Recommendations  Skilled nursing-short term rehab (<3 hours/day)     Assistance Recommended at Discharge Frequent or constant Supervision/Assistance  Patient can return home with the following  Two people to help with walking and/or transfers;A lot of help with bathing/dressing/bathroom;Assistance with cooking/housework;Direct supervision/assist for medications management;Assist for transportation;Help with stairs or ramp for entrance;Direct supervision/assist for financial management   Equipment Recommendations  Wheelchair cushion (measurements OT);Wheelchair (measurements OT)       Precautions / Restrictions Precautions Precautions: Fall Restrictions Weight Bearing Restrictions: No       Mobility Bed Mobility Overal bed mobility: Modified Independent             General bed mobility comments: with increased time     Transfers Overall transfer level: Needs assistance Equipment used: Rolling walker (2 wheels) Transfers: Sit to/from Stand Sit to Stand: Min guard, +2 safety/equipment, From elevated surface           General transfer comment: For safety only, pt able to complete transfer using bariatic RW and elevated hospital bed.     Balance Overall balance assessment: Needs assistance Sitting-balance support: Feet supported Sitting balance-Leahy Scale: Good     Standing balance support: Reliant on assistive device for balance, Bilateral upper extremity supported Standing balance-Leahy Scale: Poor                             ADL either performed or assessed with clinical judgement   ADL Overall ADL's : Needs assistance/impaired         Toilet Transfer Details (indicate cue type and reason): patient declined to walk to bathroom reporting that LLE was hurting too bad and he was unable to touch to floor without yelling in pain. patient attempted standing with +2 present for safety with min guard. patient unable to tolerate foot touching floor. patient able to take few hops on EOB to get in better position with increased pain and fatigue. patient reported he planned to go to his OP vascular appointment on 3/5. patient was educated that he needed to discuss plans with case manager as patient typically was not able to leave hospital and check back in. patient on phone with case manager at end of session.                  Cognition Arousal/Alertness: Awake/alert Behavior During Therapy: WFL for tasks assessed/performed Overall Cognitive Status: Within Functional Limits for tasks assessed  Pertinent Vitals/ Pain       Pain Assessment Pain Assessment: 0-10 Pain Score: 10-Worst pain ever Pain Location: L Leg when touching floor Pain Descriptors / Indicators: Discomfort, Grimacing, Moaning, Guarding Pain Intervention(s): Limited activity within  patient's tolerance, Monitored during session, Repositioned, Patient requesting pain meds-RN notified         Frequency  Min 2X/week        Progress Toward Goals  OT Goals(current goals can now be found in the care plan section)  Progress towards OT goals: Progressing toward goals     Plan Discharge plan remains appropriate       AM-PAC OT "6 Clicks" Daily Activity     Outcome Measure   Help from another person eating meals?: A Little Help from another person taking care of personal grooming?: A Little Help from another person toileting, which includes using toliet, bedpan, or urinal?: A Lot Help from another person bathing (including washing, rinsing, drying)?: A Lot Help from another person to put on and taking off regular upper body clothing?: A Little Help from another person to put on and taking off regular lower body clothing?: A Lot 6 Click Score: 15    End of Session Equipment Utilized During Treatment: Rolling walker (2 wheels)  OT Visit Diagnosis: Unsteadiness on feet (R26.81);Other abnormalities of gait and mobility (R26.89);Muscle weakness (generalized) (M62.81);Pain Pain - Right/Left: Left Pain - part of body: Leg   Activity Tolerance Patient limited by pain   Patient Left in bed;with call bell/phone within reach   Nurse Communication Other (comment) (ok to see patient)        Time: [id number] OT Time Calculation (min): 16 min  Charges: OT General Charges $OT Visit: 1 Visit OT Treatments $Self Care/Home Management : 8-22 mins  Rennie Plowman, MS Acute Rehabilitation Department Office# [phone]   Willa Rough 11/03/2022, 12:54 PM

## 2022-11-04 ENCOUNTER — Encounter: Payer: Self-pay | Admitting: Vascular Surgery

## 2022-11-04 DIAGNOSIS — M79605 Pain in left leg: Secondary | ICD-10-CM | POA: Diagnosis not present

## 2022-11-04 NOTE — ED Notes (Signed)
Resting quietly in bed.

## 2022-11-04 NOTE — ED Provider Notes (Signed)
Emergency Medicine Observation Re-evaluation Note  Mitchell Johnson is a 57 y.o. male, seen on rounds today.  Pt initially presented to the ED for complaints of Extremity Weakness Currently, the patient is resting in no acute distress.  Physical Exam  BP (!) 156/89 (BP Location: Left Arm)   Pulse 86   Temp 97.7 F (36.5 C) (Oral)   Resp 18   Ht '5\' 9"'$  (1.753 m)   Wt (!) 170.1 kg   SpO2 99%   BMI 55.38 kg/m  Physical Exam  General: Resting comfortably in stretcher Lungs: Normal work of breathing Psych: Calm  ED Course / MDM  EKG:   I have reviewed the labs performed to date as well as medications administered while in observation.  Recent changes in the last 24 hours include unable to be accepted to several facilities due to insurance issues  Plan  Current plan is for placement.  # Lower extremity weakness -Physical therapy or commending short-term rehab -OT also recommends short-term rehab   Fransico Meadow, MD 11/04/22 [phone]

## 2022-11-04 NOTE — Progress Notes (Addendum)
Whitney with Central Intake reports Mitchell Johnson is still pending at this time.  Addend @ 9:34 AM Provided pt an update. Pt reported he did cancel his appt today with Dr. Carlis Abbott due to being in the ED.

## 2022-11-05 DIAGNOSIS — M79605 Pain in left leg: Secondary | ICD-10-CM | POA: Diagnosis not present

## 2022-11-05 MED ORDER — HYDROCODONE-ACETAMINOPHEN 5-325 MG PO TABS
1.0000 | ORAL_TABLET | Freq: Once | ORAL | Status: AC
  Administered 2022-11-05: 1 via ORAL
  Filled 2022-11-05: qty 1

## 2022-11-05 NOTE — ED Notes (Signed)
Patient stated that his [age] year old parents are unable to drive at nighttime; therefore, he cannot leave tonight. He did state that they could pick him up tomorrow for discharge. Notified Dr. Darl Householder and charge RN, Janett Billow.

## 2022-11-05 NOTE — TOC Progression Note (Addendum)
Transition of Care [facility]) - Progression Note    Patient Details  Name: Mitchell Johnson MRN: [id number] Date of Birth: 1966-07-02  Transition of Care [facility]) CM/SW [place], RN Phone Number: 11/05/2022, 5:03 PM  Clinical Narrative:    -6:16pm This RNCM spoke with Gwendel Hanson with [phone] regarding patient's coverage for short term SNF placement. Per Gwendel Hanson patient has a co- pay $2500 per day up to an oop max of $9400. This RNCM spoke with patient who is not agreement with paying $2500 per day for SNF. This RNCM assessed patient regarding possible needed DME (walker, 3N1) and HH (HHPT,/OT, HHA) services patient declined any additional DME. Patient reports he has crutches and semi walker at home. Patient reports he has rescheduled his appointment with his surgeon for Tuesday 11/11/22 and will plan to go there. Patient reports he needs EMS transportation home. This RNCM notified MD, RN.  [place] orders are in awaiting [facility] agencies Latricia Heft, [place]) to respond, [place] and [place] have declined.  This RNCM will follow up with patient post discharge. Notified RN who will coordinate PTAR for discharge.  No additional TOC needs at this time.  -5:03p This RNCM spoke with Lelon Frohlich with [facility] who confirmed patient has coverage effective 10/31/22. This RNCM spoke with patient at bedside to advise of current status. This RNCM notified Whitney with [facility] to advise of patient having effective coverage, awaiting a call back.      Barriers to Discharge: ED SNF auth  Expected Discharge Plan and Services                                               Social Determinants of Health (SDOH) Interventions SDOH Screenings   Tobacco Use: High Risk (10/31/2022)    Readmission Risk Interventions     No data to display

## 2022-11-05 NOTE — Progress Notes (Addendum)
Pt's insurance authorization is still pending at this time. Whitney to inform TOC with updates are available.   Addend @ 12:36 PM This CSW attempted to contact [place] to verify pt's insurance. Pt has insurance card that states it goes into effect on 10/31/22; however, CM at SNF was told pt does not have insurance. This CSW has called to verify. Awaiting call back.   Addend @ 1:19 PM This CSW received a call from [facility] and the rep informed this writer that the pt had a "Bronze 4" plan that was only active for 10/31/22. This Probation officer asked the rep why the insurance was only active for that day and rep was unable to and stated there is not much of anything she could see regarding the coverage due to it being terminated.

## 2022-11-05 NOTE — Progress Notes (Signed)
Physical Therapy Treatment Patient Details Name: CLELLAN GANGULY MRN: [id number] DOB: 1966-06-13 Today's Date: 11/05/2022   History of Present Illness Patient is a 57 year old male presenting to [facility] ED on 3/1 complaining of multiple falls with LLE pain, recently seen 2/3 for L-DVT. MRI showed disc bulge with facet hypertrophy at L3-4 with spinal stenosis and at L5-S1. PMH: HLD, tobacco use    PT Comments    The  patient continues with reports of LLE pain, patient does not tolerate any weight  on the LLE when standing, thus limiting tolerance to ambulation.  Patient reports a H/O gout but states the current pain does not feel like gout. PT does not see any test results to indicate why patient has such significant pain in 1 lower extremity. Patient has  H/O L DVT.  Continue  efforts to  progress ambulation as tolerated.  Recommendations for follow up therapy are one component of a multi-disciplinary discharge planning process, led by the attending physician.  Recommendations may be updated based on patient status, additional functional criteria and insurance authorization.  Follow Up Recommendations  Skilled nursing-short term rehab (<3 hours/day) Can patient physically be transported by private vehicle: Yes   Assistance Recommended at Discharge Intermittent Supervision/Assistance  Patient can return home with the following Help with stairs or ramp for entrance;Assist for transportation;Assistance with cooking/housework;A little help with bathing/dressing/bathroom;A little help with walking and/or transfers   Equipment Recommendations  None recommended by PT    Recommendations for Other Services       Precautions / Restrictions Precautions Precautions: Fall     Mobility  Bed Mobility Overal bed mobility: Modified Independent                  Transfers Overall transfer level: Needs assistance Equipment used: Rolling walker (2 wheels) Transfers: Sit to/from Stand Sit  to Stand: Min guard           General transfer comment: patient bearing no weight on LLE and barely allows foot to rest on the floor    Ambulation/Gait Ambulation/Gait assistance: Min assist, +2 safety/equipment Gait Distance (Feet): 4 Feet Assistive device: Rolling walker (2 wheels) Gait Pattern/deviations: Step-to pattern, [place]       General Gait Details: Pt utilized "hopping" method for forward and retro ambulation with HEAVY BUE support on RW, pt able to place LLE flat on the floor but gingerly with fear of weightbearing. Min assist for steadying, distance limited by LLE PAIN AND  FEAR THAT RIGHT KNEE WILL BUCKLE.   Stairs             Wheelchair Mobility    Modified Rankin (Stroke Patients Only)       Balance Overall balance assessment: Independent, History of Falls Sitting-balance support: Feet supported Sitting balance-Leahy Scale: Good     Standing balance support: Reliant on assistive device for balance, Bilateral upper extremity supported, During functional activity Standing balance-Leahy Scale: Poor Standing balance comment: unable to keep LLE on ground                            Cognition Arousal/Alertness: Awake/alert Behavior During Therapy: WFL for tasks assessed/performed Overall Cognitive Status: Within Functional Limits for tasks assessed  Exercises Other Exercises Other Exercises: LLE:use of belt to stretch calf and posterior knee, quad sets    General Comments        Pertinent Vitals/Pain Pain Assessment Pain Score: 10-Worst pain ever Pain Location: L Leg when touching floor Pain Descriptors / Indicators: Discomfort, Grimacing, Moaning, Guarding Pain Intervention(s): Limited activity within patient's tolerance, Monitored during session    Home Living                          Prior Function            PT Goals (current goals can now be found  in the care plan section) Progress towards PT goals: Progressing toward goals    Frequency    Min 2X/week      PT Plan Current plan remains appropriate    Co-evaluation              AM-PAC PT "6 Clicks" Mobility   Outcome Measure  Help needed turning from your back to your side while in a flat bed without using bedrails?: None Help needed moving from lying on your back to sitting on the side of a flat bed without using bedrails?: None Help needed moving to and from a bed to a chair (including a wheelchair)?: A Little Help needed standing up from a chair using your arms (e.g., wheelchair or bedside chair)?: A Little Help needed to walk in hospital room?: Total Help needed climbing 3-5 steps with a railing? : Total 6 Click Score: 16    End of Session Equipment Utilized During Treatment: Gait belt Activity Tolerance: Patient limited by pain Patient left: in bed;with call bell/phone within reach Nurse Communication: Mobility status PT Visit Diagnosis: History of falling (Z91.81);Other abnormalities of gait and mobility (R26.89)     Time: 0938-1000 PT Time Calculation (min) (ACUTE ONLY): 22 min  Charges:  $Gait Training: 8-22 mins                     [place] Office [phone] Weekend [id number]    Claretha Cooper 11/05/2022, 12:46 PM

## 2022-11-05 NOTE — ED Provider Notes (Signed)
Emergency Medicine Observation Re-evaluation Note  Mitchell Johnson is a 57 y.o. male, seen on rounds today.  Pt initially presented to the ED for complaints of Extremity Weakness Currently, the patient is sleeping in his room.  Per nursing staff, no overnight events.  Per review of social work note from yesterday, patient is still pending placement.  OT note from 3-4 also reviewed.  Physical Exam  BP 127/78 (BP Location: Right Arm)   Pulse 86   Temp 98.2 F (36.8 C) (Oral)   Resp 18   Ht '5\' 9"'$  (1.753 m)   Wt (!) 170.1 kg   SpO2 100%   BMI 55.38 kg/m  Physical Exam General: No distress Cardiac: No tachycardia per vital signs overnight Lungs: No respiratory distress Psych: Cooperative per nursing staff  ED Course / MDM  EKG:   I have reviewed the labs performed to date as well as medications administered while in observation.  Recent changes in the last 24 hours include : No new changes.  Plan  Current plan is for await placement from [facility] team.  Continue expectant management.    Varney Biles, MD 11/05/22 [phone]

## 2022-11-05 NOTE — Discharge Instructions (Signed)
I have ordered home health with an aide and physical therapy  Please continue current meds  Return to ER if you have trouble walking, falling   [facility] is your home health agency. They will contact you today to get you on their schedule for physical therapy and occupational therapy.   A wheelchair was ordered through [employer], a durable medical equipment agency. The wheelchair will be delivered to your home.

## 2022-11-05 NOTE — ED Provider Notes (Signed)
  Physical Exam  BP (!) 151/89 (BP Location: Right Arm)   Pulse 92   Temp 98.2 F (36.8 C) (Oral)   Resp 18   Ht '5\' 9"'$  (1.753 m)   Wt (!) 170.1 kg   SpO2 100%   BMI 55.38 kg/m   Physical Exam  Procedures  Procedures  ED Course / MDM    Medical Decision Making Care assumed at 4 PM.  Patient has trouble walking and PT OT recommended rehab.  Patient would like to go home.  Social work recommend that I put in home health orders.  Patient stable for discharge.  Amount and/or Complexity of Data Reviewed Labs: ordered. Decision-making details documented in ED Course. Radiology: ordered and independent interpretation performed. Decision-making details documented in ED Course.  Risk Prescription drug management.          Drenda Freeze, MD 11/05/22 [phone]

## 2022-11-06 NOTE — Progress Notes (Addendum)
Pt reports he believed his parents may be here to pick him up within the next hour or so.   Addend @ 8:15 AM [facility] has accepted the pt for PT/OT, reports they are unable to provide an Aide at this time. Pt will be on their schedule as early as tomorrow. Pt notified.  Addend @ 8:23 AM Pt is requesting a wheelchair at this time. EDP has entered orders. Contacted Jermaine at [employer] to request wheelchair be delivered to the pt's home (per pt request).  Addend @ 11:40 AM This CSW received a call from the pt stating he'd just received a call from Medicaid that he's approved for [employer]. Pt inquired about placement. This CSW informed due to him being discharged, he would have to go through his PCP for placement. This CSW set the pt up a New Patient Appointment at [facility] for Wednesday, November 19, 2022 at 10:45 AM. Pt notified.

## 2022-11-11 ENCOUNTER — Ambulatory Visit (INDEPENDENT_AMBULATORY_CARE_PROVIDER_SITE_OTHER): Payer: Medicaid Other | Admitting: Vascular Surgery

## 2022-11-11 ENCOUNTER — Encounter: Payer: Self-pay | Admitting: Vascular Surgery

## 2022-11-11 VITALS — BP 156/103 | HR 101 | Temp 97.2°F | Resp 16 | Ht 69.0 in | Wt 366.0 lb

## 2022-11-11 DIAGNOSIS — I739 Peripheral vascular disease, unspecified: Secondary | ICD-10-CM | POA: Insufficient documentation

## 2022-11-11 NOTE — Progress Notes (Signed)
Patient name: Mitchell Johnson MRN: [id number] DOB: July 25, 1966 Sex: male  REASON FOR CONSULT: Right common iliac artery occlusion, claudication  HPI: Mitchell Johnson is a 57 y.o. male, with history of hyperlipidemia that presents for evaluation of leg pain and possible PAD.  He was initially seen in the ED with leg pain and had a CTA showing a right common iliac artery occlusion.  He states he has been having pain in both legs.  This is worse in the left leg.  He has a lot of pain around the left knee joint with swelling.  States he really has a hard time walking and is using crutches.  This has been ongoing for the last 3 to 4 weeks.  He did have ABI's 10/23/22 that were 0.56 right biphasic and 1.0 left triphasic.  No previous vascular surgery interventions.  He denies any classic cramping symptoms in his legs when he walks.  States that this did happen about 3 to 4 weeks ago but has had no recurrent symptoms since.    Past Medical History:  Diagnosis Date   Hyperlipidemia     History reviewed. No pertinent surgical history.  Family History  Problem Relation Age of Onset   Diabetes Mother    Hypertension Mother    Diabetes Father     SOCIAL HISTORY: Social History   Socioeconomic History   Marital status: Single    Spouse name: Not on file   Number of children: Not on file   Years of education: Not on file   Highest education level: Not on file  Occupational History   Not on file  Tobacco Use   Smoking status: Some Days    Packs/day: 0.30    Types: Cigars, Cigarettes   Smokeless tobacco: Never  Vaping Use   Vaping Use: Never used  Substance and Sexual Activity   Alcohol use: No    Alcohol/week: 0.0 standard drinks of alcohol   Drug use: Never   Sexual activity: Not on file  Other Topics Concern   Not on file  Social History Narrative   Widowed 2006. Remarried 02/15/2011.   Social Determinants of Health   Financial Resource Strain: Not on file  Food Insecurity:  Not on file  Transportation Needs: Not on file  Physical Activity: Not on file  Stress: Not on file  Social Connections: Not on file  Intimate Partner Violence: Not on file    No Known Allergies  Current Outpatient Medications  Medication Sig Dispense Refill   APIXABAN (ELIQUIS) VTE STARTER PACK ('10MG'$  AND '5MG'$ ) Take as directed on package: start with two-'5mg'$  tablets twice daily for 7 days. On day 8, switch to one-'5mg'$  tablet twice daily. 1 each 0   ALEVE 220 MG tablet Take 1,100 mg by mouth daily as needed (for pain).     apixaban (ELIQUIS) 5 MG TABS tablet Take 2 tablets (10 mg total) by mouth 2 (two) times daily for 7 days. Then '5mg'$  (1 tab) 2 (two) times daily for the rest of the month. (Patient not taking: Reported on 10/31/2022) 74 tablet 0   HYDROcodone-acetaminophen (NORCO/VICODIN) 5-325 MG tablet Take 2 tablets by mouth every 4 (four) hours as needed for up to 15 doses. (Patient not taking: Reported on 10/31/2022) 15 tablet 0   No current facility-administered medications for this visit.    REVIEW OF SYSTEMS:  '[X]'$  denotes positive finding, '[ ]'$  denotes negative finding Cardiac  Comments:  Chest pain or chest pressure:  Shortness of breath upon exertion:    Short of breath when lying flat:    Irregular heart rhythm:        Vascular    Pain in calf, thigh, or hip brought on by ambulation:    Pain in feet at night that wakes you up from your sleep:     Blood clot in your veins:    Leg swelling:         Pulmonary    Oxygen at home:    Productive cough:     Wheezing:         Neurologic    Sudden weakness in arms or legs:     Sudden numbness in arms or legs:     Sudden onset of difficulty speaking or slurred speech:    Temporary loss of vision in one eye:     Problems with dizziness:         Gastrointestinal    Blood in stool:     Vomited blood:         Genitourinary    Burning when urinating:     Blood in urine:        Psychiatric    Major depression:          Hematologic    Bleeding problems:    Problems with blood clotting too easily:        Skin    Rashes or ulcers:        Constitutional    Fever or chills:      PHYSICAL EXAM: Vitals:   11/11/22 0852  BP: (!) 156/103  Pulse: (!) 101  Resp: 16  Temp: (!) 97.2 F (36.2 C)  TempSrc: Temporal  SpO2: 97%  Weight: (!) 366 lb (166 kg)  Height: '5\' 9"'$  (1.753 m)    GENERAL: The patient is a well-nourished male, in no acute distress. The vital signs are documented above. CARDIAC: There is a regular rate and rhythm.  VASCULAR:  Bilateral femoral pulses palpable Left DP palpable Right DP brisk and biphasic No lower extremity tissue loss PULMONARY: No respiratory distress. ABDOMEN: Soft and non-tender. MUSCULOSKELETAL: There are no major deformities or cyanosis. NEUROLOGIC: No focal weakness or paresthesias are detected. SKIN: There are no ulcers or rashes noted. PSYCHIATRIC: The patient has a normal affect.  DATA:   CTA reviewed from 10/15/2022 with right common iliac occlusion  ABI's 10/23/22 that were 0.56 right biphasic and 1.0 left triphasic.  Assessment/Plan:  57 year old male presents for evaluation of PAD with bilateral leg pain and unsteadiness.  His dominant complaint today is his left leg particularly around the left knee joint where he states he has swelling.  He is walking with crutches today.  I discussed that his ABIs on the left are normal 1.0 and triphasic with no evidence of significant arterial disease.  He has a palpable DP pulse in the left foot.  I reviewed his CTA that shows no significant occlusive disease on the left.  He does have a right common iliac artery occlusion with an ABI of 0.56 on the right but really does not endorse any calf cramping in the last 3 to 4 weeks.  I think he likely has some other etiology again with the left knee being his predominant complaint today.  I have recommended he get evaluated by orthopedic surgery and I will send him to see  Dr. Sharol Given.  I will see him back in 3 months to see how he is doing.  Discussed  I would delay any right iliac intervention at this time given that is not his complaint today.   Marty Heck, MD [facility] Office: [phone]

## 2022-11-19 ENCOUNTER — Telehealth: Payer: Self-pay | Admitting: *Deleted

## 2022-11-19 ENCOUNTER — Encounter: Payer: Self-pay | Admitting: Student

## 2022-11-19 ENCOUNTER — Ambulatory Visit (INDEPENDENT_AMBULATORY_CARE_PROVIDER_SITE_OTHER): Payer: 59 | Admitting: Student

## 2022-11-19 VITALS — BP 141/87 | HR 94 | Wt 375.2 lb

## 2022-11-19 DIAGNOSIS — E782 Mixed hyperlipidemia: Secondary | ICD-10-CM

## 2022-11-19 DIAGNOSIS — I10 Essential (primary) hypertension: Secondary | ICD-10-CM | POA: Diagnosis not present

## 2022-11-19 DIAGNOSIS — Z13220 Encounter for screening for lipoid disorders: Secondary | ICD-10-CM | POA: Diagnosis not present

## 2022-11-19 DIAGNOSIS — I739 Peripheral vascular disease, unspecified: Secondary | ICD-10-CM

## 2022-11-19 DIAGNOSIS — R7309 Other abnormal glucose: Secondary | ICD-10-CM | POA: Diagnosis not present

## 2022-11-19 DIAGNOSIS — Z114 Encounter for screening for human immunodeficiency virus [HIV]: Secondary | ICD-10-CM | POA: Diagnosis not present

## 2022-11-19 DIAGNOSIS — I7090 Unspecified atherosclerosis: Secondary | ICD-10-CM

## 2022-11-19 DIAGNOSIS — Z1159 Encounter for screening for other viral diseases: Secondary | ICD-10-CM | POA: Diagnosis not present

## 2022-11-19 DIAGNOSIS — M25562 Pain in left knee: Secondary | ICD-10-CM

## 2022-11-19 DIAGNOSIS — Z1211 Encounter for screening for malignant neoplasm of colon: Secondary | ICD-10-CM

## 2022-11-19 LAB — POCT GLYCOSYLATED HEMOGLOBIN (HGB A1C): Hemoglobin A1C: 5.7 % — AB (ref 4.0–5.6)

## 2022-11-19 LAB — GLUCOSE, CAPILLARY: Glucose-Capillary: 88 mg/dL (ref 70–99)

## 2022-11-19 MED ORDER — LOSARTAN POTASSIUM 25 MG PO TABS: 25.0000 mg | ORAL_TABLET | Freq: Every day | ORAL | 1 refills | Status: DC

## 2022-11-19 MED ORDER — DICLOFENAC SODIUM 1 % EX GEL: 2.0000 g | Freq: Four times a day (QID) | CUTANEOUS | 1 refills | Status: AC

## 2022-11-19 MED ORDER — IBUPROFEN 400 MG PO TABS: 400.0000 mg | ORAL_TABLET | Freq: Four times a day (QID) | ORAL | 0 refills | Status: AC | PRN

## 2022-11-19 NOTE — Telephone Encounter (Signed)
Yes, he still does.

## 2022-11-19 NOTE — Progress Notes (Signed)
CC: Establish care  HPI:  [name] is a 57 y.o. male with PMH as below who presents to clinic to establish care. Please see problem based charting for evaluation, assessment and plan.  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 Currently lives with his 72 year old parents. 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 He has been smoking 1 ppd/day of cigarettes for the past 22 years. He drinks about 1/5 of liquor on the weekends. He denies any other drug use.  Review of Systems:  Constitutional: Negative for fever or fatigue Eyes: Negative for visual changes Respiratory: Negative for shortness of breath Cardiac: Negative for chest pain MSK: Positive for left leg pain and left knee swelling. Negative for back pain or trauma Abdomen: Negative for abdominal pain, constipation or diarrhea Neuro: Positive for difficulty with ambulation. Negative for headache, numbness, tingling or weakness  Physical Exam: General: Pleasant, morbidly obese middle-age man. No acute distress. Cardiac: Mildly tachycardic. Regular rhythm. No murmurs, rubs or gallops. Respiratory: Lungs CTAB. No wheezing or crackles. Abdominal: Soft, symmetric and non tender. Normal BS. Skin: Warm, dry and intact without rashes or lesions MSK: Mild edema to the left knee without effusion, crepitus or significant tenderness to palpation. Limited extension and flexion of the knee. No calf tenderness. Extremities: Atraumatic. Full ROM. Palpable radial and DP pulses. Neuro: A&O x 3. Moves all extremities. Normal sensation to gross touch. Psych: Appropriate mood and affect.  Vitals:   11/19/22 1101 11/19/22 1121  BP: (!) 149/81 (!) 141/87  Pulse: 97 94  SpO2: 100%   Weight: (!) 375 lb 3.2 oz (170.2  kg)     Assessment & Plan:   Left knee pain Patient presents today to establish care and for evaluation of ongoing left knee pain and swelling that has been ongoing for about 2 months. He first presented to the ED on 2/3 after hearing a pop on his left leg while getting out of his truck. In the ED, he was found to have an age-indeterminate DVT of his left gastrocnemius veins.  X-ray of the knee showed mild-mod degenerative changes as well as moderate patellofemoral and severe medial compartment joint space narrowing. Patient was discharged on Eliquis starter pack pain medications and crutches to help with ambulation due to difficulty in putting weight on the left leg. He presented back to the ED on 2/14 and bilateral leg pain and concern about having a clot in his right leg. 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 Vascular surgery was consulted and patient was advised to follow-up with them in the outpatient. Patient presented back to the ED on 3/1 due to left leg pain and swelling as well as trouble walking and a repeat DVT study remain negative. MRI of the L-spine did show degenerative disc disease and spinal stenosis. PT/OT recommended SNF placement for acute rehab however patient not have insurance at that time. CSW was consulted and patient stayed in the ED for 6 days pending placement. Patient eventually decided to go home he was discharged with home health PT/OT.  He was seen  by vascular surgery on 3/12 and was told that his symptoms are not due to his vascular problems which is actually in his right leg. He continues to use the crutches and presenting today to be evaluated for his left knee pain and swelling. On exam, patient has mild swelling of his left knee but his left leg slightly bigger than the right however I do not appreciate any crepitus he does  not have any tenderness to palpation of the knee or the leg. Discussed with patient that his left leg symptoms are likely due to the findings on his knee x-ray last month which are likely exacerbated by his weight. Discussed plan to continue conservative management for now and start physical therapy.  Plan: -Continue home health PT/OT/CSW referral from ED -Start Voltaren 1% gel, apply to left knee 4 times daily -Start ibuprofen 400 mg every 6 hours as needed for pain -Elevate left leg as much as possible and apply compression socks  Hypertension Patient found to have BP of 156/103 during visit with vascular surgery about 2 weeks ago. Blood pressure elevated to the 140s/80s in clinic today. Discussed plan to start patient on antihypertensive medication.  He reports a history of gout so we will initiate losartan and repeat BP in the next few weeks.  Plan: -Start losartan 25 mg daily -Follow-up in 3 to 4 weeks for repeat BP and BMP  Hyperlipidemia Lipid panel today showed T cholesterol 216, triglycerides 146, HDL 39 and LDL 150. Patient's ASCVD score is 26.7%, indicating a need for moderate to high intensity statin. Counseled patient on dietary changes including decreasing intake of processed and fatty foods and increasing intake of vegetables, fish, whole grains and legumes.  Plan: -Start atorvastatin 40 mg daily -Continue lifestyle modifications with dietary changes and exercise -Repeat lipid panel in 6 months  PAD (peripheral artery disease) ([place]) Follows with vascular surgery. ABI on 10/23/2022 showed moderate disease on the right. CTA runoff showed right common iliac artery occlusion but no significant occlusion on the left. Patient denies any recent cramping of his legs and his symptoms of pain and swelling are in his left leg.   Plan: -Follow-up with vascular surgery as scheduled on 02/17/2023 -Start atorvastatin 40 mg daily   See Encounters Tab for problem based charting.  Patient  discussed with Dr. Lockie Pares, MD, MPH

## 2022-11-19 NOTE — Telephone Encounter (Signed)
Received call from Santiago Glad with [facility] wanting to know if Provider who saw patient today feels patient still needs HH PT/OT/SW. Original orders were written by [facility] ED.

## 2022-11-19 NOTE — Patient Instructions (Addendum)
Thank you, [name] for allowing [place] to provide your care today. Today we establish you in our clinic and discussed your left knee pain, blood pressure, labs and cancer screening.    I have ordered the following labs for you:   Lab Orders         Lipid Profile         Hepatitis C Ab reflex to Quant PCR         HIV antibody (with reflex)         POC Hbg A1C      I will call if any are abnormal. All of your labs can be accessed through "My Chart".  I have place a referrals to GI for colonoscopy  I have ordered the following medication/changed the following medications:  Start losartan 25 mg daily Start Voltaren gel, apply to left knee 4 times daily Start ibuprofen 400 mg every 6 hours as needed for pain  My Chart Access: [email or web address]  Please follow-up in 2-3 weeks with Dr. Coy Saunas  Please make sure to arrive 15 minutes prior to your next appointment. If you arrive late, you may be asked to reschedule.    We look forward to seeing you next time. Please call our clinic at [phone] if you have any questions or concerns. The best time to call is Monday-Friday from 9am-4pm, but there is someone available 24/7. If after hours or the weekend, call the main hospital number and ask for the Internal Medicine Resident On-Call. If you need medication refills, please notify your pharmacy one week in advance and they will send [place] a request.   Thank you for letting us take part in your care. Wishing you the best!  Lacinda Axon, MD 11/19/2022, 12:08 PM IM Resident, PGY-3 [facility] Milian 41:10

## 2022-11-20 DIAGNOSIS — E785 Hyperlipidemia, unspecified: Secondary | ICD-10-CM | POA: Insufficient documentation

## 2022-11-20 DIAGNOSIS — I10 Essential (primary) hypertension: Secondary | ICD-10-CM | POA: Insufficient documentation

## 2022-11-20 LAB — HCV AB W REFLEX TO QUANT PCR: HCV Ab: NONREACTIVE

## 2022-11-20 LAB — LIPID PANEL
Chol/HDL Ratio: 5.5 ratio — ABNORMAL HIGH (ref 0.0–5.0)
Cholesterol, Total: 216 mg/dL — ABNORMAL HIGH (ref 100–199)
HDL: 39 mg/dL — ABNORMAL LOW (ref >39)
LDL Chol Calc (NIH): 150 mg/dL — ABNORMAL HIGH (ref 0–99)
Triglycerides: 146 mg/dL (ref 0–149)
VLDL Cholesterol Cal: 27 mg/dL (ref 5–40)

## 2022-11-20 LAB — HIV ANTIBODY (ROUTINE TESTING W REFLEX): HIV Screen 4th Generation wRfx: NONREACTIVE

## 2022-11-20 LAB — HCV INTERPRETATION

## 2022-11-20 NOTE — Telephone Encounter (Signed)
Santiago Glad notified that patient still requires HH PT/OT/SW.

## 2022-11-21 ENCOUNTER — Telehealth: Payer: Self-pay

## 2022-11-21 ENCOUNTER — Encounter: Payer: Self-pay | Admitting: Student

## 2022-11-21 DIAGNOSIS — I7090 Unspecified atherosclerosis: Secondary | ICD-10-CM | POA: Insufficient documentation

## 2022-11-21 DIAGNOSIS — M25562 Pain in left knee: Secondary | ICD-10-CM | POA: Insufficient documentation

## 2022-11-21 MED ORDER — ATORVASTATIN CALCIUM 40 MG PO TABS: 40.0000 mg | ORAL_TABLET | Freq: Every day | ORAL | 3 refills | Status: AC

## 2022-11-21 NOTE — Assessment & Plan Note (Addendum)
Lipid panel today showed T cholesterol 216, triglycerides 146, HDL 39 and LDL 150. Patient's ASCVD score is 26.7%, indicating a need for moderate to high intensity statin. Counseled patient on dietary changes including decreasing intake of processed and fatty foods and increasing intake of vegetables, fish, whole grains and legumes.  Plan: -Start atorvastatin 40 mg daily -Continue lifestyle modifications with dietary changes and exercise -Repeat lipid panel in 6 months

## 2022-11-21 NOTE — Assessment & Plan Note (Signed)
Follows with vascular surgery. ABI on 10/23/2022 showed moderate disease on the right. CTA runoff showed right common iliac artery occlusion but no significant occlusion on the left. Patient denies any recent cramping of his legs and his symptoms of pain and swelling are in his left leg.   Plan: -Follow-up with vascular surgery as scheduled on 02/17/2023 -Start atorvastatin 40 mg daily

## 2022-11-21 NOTE — Progress Notes (Signed)
Internal Medicine Clinic Attending  Case discussed with Dr. Amponsah  At the time of the visit.  We reviewed the resident's history and exam and pertinent patient test results.  I agree with the assessment, diagnosis, and plan of care documented in the resident's note.  

## 2022-11-21 NOTE — Assessment & Plan Note (Signed)
Patient presents today to establish care and for evaluation of ongoing left knee pain and swelling that has been ongoing for about 2 months. He first presented to the ED on 2/3 after hearing a pop on his left leg while getting out of his truck. In the ED, he was found to have an age-indeterminate DVT of his left gastrocnemius veins.  X-ray of the knee showed mild-mod degenerative changes as well as moderate patellofemoral and severe medial compartment joint space narrowing. Patient was discharged on Eliquis starter pack pain medications and crutches to help with ambulation due to difficulty in putting weight on the left leg. He presented back to the ED on 2/14 and bilateral leg pain and concern about having a clot in his right leg. 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 Vascular surgery was consulted and patient was advised to follow-up with them in the outpatient. Patient presented back to the ED on 3/1 due to left leg pain and swelling as well as trouble walking and a repeat DVT study remain negative. MRI of the L-spine did show degenerative disc disease and spinal stenosis. PT/OT recommended SNF placement for acute rehab however patient not have insurance at that time. CSW was consulted and patient stayed in the ED for 6 days pending placement. Patient eventually decided to go home he was discharged with home health PT/OT.  He was seen by vascular surgery on 3/12 and was told that his symptoms are not due to his vascular problems which is actually in his right leg. He continues to use the crutches and presenting today to be evaluated for his left knee pain and swelling. On exam, patient has mild swelling of his left knee but his left leg slightly bigger than the right however I do not appreciate any crepitus he does not have any tenderness to palpation of the knee  or the leg. Discussed with patient that his left leg symptoms are likely due to the findings on his knee x-ray last month which are likely exacerbated by his weight. Discussed plan to continue conservative management for now and start physical therapy.  Plan: -Continue home health PT/OT/CSW referral from ED -Start Voltaren 1% gel, apply to left knee 4 times daily -Start ibuprofen 400 mg every 6 hours as needed for pain -Elevate left leg as much as possible and apply compression socks

## 2022-11-21 NOTE — Assessment & Plan Note (Signed)
Patient found to have BP of 156/103 during visit with vascular surgery about 2 weeks ago. Blood pressure elevated to the 140s/80s in clinic today. Discussed plan to start patient on antihypertensive medication.  He reports a history of gout so we will initiate losartan and repeat BP in the next few weeks.  Plan: -Start losartan 25 mg daily -Follow-up in 3 to 4 weeks for repeat BP and BMP

## 2022-11-21 NOTE — Telephone Encounter (Signed)
Thanks. I have discussed his results with him.

## 2022-11-21 NOTE — Telephone Encounter (Signed)
Requesting lab results,please call tp back.

## 2022-11-25 ENCOUNTER — Telehealth: Payer: Self-pay | Admitting: *Deleted

## 2022-11-25 DIAGNOSIS — Z6841 Body Mass Index (BMI) 40.0 and over, adult: Secondary | ICD-10-CM | POA: Diagnosis not present

## 2022-11-25 DIAGNOSIS — M6281 Muscle weakness (generalized): Secondary | ICD-10-CM | POA: Diagnosis not present

## 2022-11-25 DIAGNOSIS — M79604 Pain in right leg: Secondary | ICD-10-CM | POA: Diagnosis not present

## 2022-11-25 DIAGNOSIS — I70213 Atherosclerosis of native arteries of extremities with intermittent claudication, bilateral legs: Secondary | ICD-10-CM | POA: Diagnosis not present

## 2022-11-25 DIAGNOSIS — M79605 Pain in left leg: Secondary | ICD-10-CM | POA: Diagnosis not present

## 2022-11-25 DIAGNOSIS — E785 Hyperlipidemia, unspecified: Secondary | ICD-10-CM | POA: Diagnosis not present

## 2022-11-25 NOTE — Telephone Encounter (Signed)
Mitchell Johnson PT [id number] [facility] can be reached at [phone]

## 2022-11-25 NOTE — Telephone Encounter (Signed)
Call from [place], [facility].  Patient's B/P to day was 142 110.  Brief 10 second of being dizzy.  Feels ok now. Took Losartan at 25mg  at 4 am this morning. Now has c/o a headache.  Has a follow up appointment scheduled for 12/05/2022.  Needs Verbal orders for PT 1 time a week for 4 weeks.  Needs for Home exercise, Gait training , Strengthening and pain Management. Patient asked about his referral to Ortho.  Patient had refused when [facility] ER asked him to go .  Would like to get a referral for. Elzie Rings ,PT from [facility] feels that patient will benefit from the referral.  Patient was scheduled for an appointment for his elevated B/P for 11/26/2022 1:45 PM.

## 2022-11-26 ENCOUNTER — Other Ambulatory Visit: Payer: Self-pay

## 2022-11-26 ENCOUNTER — Encounter: Payer: Self-pay | Admitting: Student

## 2022-11-26 ENCOUNTER — Ambulatory Visit (INDEPENDENT_AMBULATORY_CARE_PROVIDER_SITE_OTHER): Payer: 59 | Admitting: Student

## 2022-11-26 VITALS — BP 134/79 | HR 84 | Temp 98.2°F | Ht 69.5 in | Wt 382.3 lb

## 2022-11-26 DIAGNOSIS — M79605 Pain in left leg: Secondary | ICD-10-CM | POA: Diagnosis not present

## 2022-11-26 DIAGNOSIS — I10 Essential (primary) hypertension: Secondary | ICD-10-CM

## 2022-11-26 DIAGNOSIS — M79604 Pain in right leg: Secondary | ICD-10-CM | POA: Diagnosis not present

## 2022-11-26 DIAGNOSIS — M1712 Unilateral primary osteoarthritis, left knee: Secondary | ICD-10-CM

## 2022-11-26 DIAGNOSIS — M25562 Pain in left knee: Secondary | ICD-10-CM

## 2022-11-26 DIAGNOSIS — Z1211 Encounter for screening for malignant neoplasm of colon: Secondary | ICD-10-CM

## 2022-11-26 DIAGNOSIS — I70213 Atherosclerosis of native arteries of extremities with intermittent claudication, bilateral legs: Secondary | ICD-10-CM | POA: Diagnosis not present

## 2022-11-26 DIAGNOSIS — Z6841 Body Mass Index (BMI) 40.0 and over, adult: Secondary | ICD-10-CM | POA: Diagnosis not present

## 2022-11-26 DIAGNOSIS — E785 Hyperlipidemia, unspecified: Secondary | ICD-10-CM | POA: Diagnosis not present

## 2022-11-26 DIAGNOSIS — M6281 Muscle weakness (generalized): Secondary | ICD-10-CM | POA: Diagnosis not present

## 2022-11-26 MED ORDER — LOSARTAN POTASSIUM 50 MG PO TABS: 50.0000 mg | ORAL_TABLET | Freq: Every day | ORAL | 1 refills | Status: AC

## 2022-11-26 NOTE — Assessment & Plan Note (Signed)
Referral to GI for screening colonoscopy placed at last office visit.

## 2022-11-26 NOTE — Assessment & Plan Note (Signed)
Patient with HTN, currently on losartan 25mg  daily. He was noted to have high BP of 140s/100s by [facility] PT and recommended to come in for further evaluation. He states that he took 3 tablets of his home losartan yesterday for better BP control. BP in clinic today is 134/79. We discussed not taking more than prescribed amount of his home BP medications as this can be dangerous and potentially life-threatening, he confirms understanding. Will increase his losartan to 50mg  daily for better control of HTN. He has a follow up appointment scheduled on 4/5 with Dr. Johnney Ou. Plan to repeat BP at that time to ensure at goal. If not, can consider further increasing losartan dose or adding an additional antihypertensive medication to bring BP to goal.  Plan: -increased losartan to 50mg  daily -f/u on 4/5 for repeat BP

## 2022-11-26 NOTE — Patient Instructions (Signed)
Mr. Mitchell Johnson,  It was a pleasure seeing you in the clinic today.   For your high blood pressure: we are increasing your losartan dose to 50mg  daily. I have sent a new prescription to your pharmacy but you can take 2 tablets of your current dose for now. Please do not take more than this amount.  Your next clinic appointment is on 12/05/2022 with Dr. Johnney Ou at 9:45AM.  I have also placed an orthopedic referral for you for your left knee. They will contact you to schedule an appointment.   Please call our clinic at [phone] if you have any questions or concerns. The best time to call is Monday-Friday from 9am-4pm, but there is someone available 24/7 at the same number. If you need medication refills, please notify your pharmacy one week in advance and they will send [place] a request.   Thank you for letting us take part in your care. We look forward to seeing you next time!

## 2022-11-26 NOTE — Progress Notes (Signed)
   CC: HTN f/u  HPI:  [name] is a 57 y.o. male with history listed below presenting to the [facility] for HTN f/u. Please see individualized problem based charting for full HPI.  Past Medical History:  Diagnosis Date   Hyperlipidemia    Hypertension    Osteoarthritis of left knee    PAD (peripheral artery disease) ([place])     Review of Systems:  Negative aside from that listed in individualized problem based charting.  Physical Exam:  Vitals:   11/26/22 1322  BP: 134/79  Pulse: 84  Temp: 98.2 F (36.8 C)  TempSrc: Oral  SpO2: 100%  Weight: (!) 382 lb 4.8 oz (173.4 kg)  Height: 5' 9.5" (1.765 m)   General: morbidly obese middle-aged gentleman, sitting in chair, NAD. Eyes: PERRL, EOMI, no scleral icterus. CV: normal rate and regular rhythm, no m/r/g. No peripheral edema. Pulm: lungs clear to auscultation bilaterally, no adventitious sounds noted. Normal WOB on RA. Abdomen: soft, nondistended, nontender, normoactive bowel sounds. MSK: L knee with mild edema and mild TTP. No effusions, crepitus, erythema noted. L knee extension and flexion mildly limited. Skin: warm and dry. Neuro: AAOx3, no focal deficits noted. Psych: normal mood and affect.  Assessment & Plan:   See Encounters Tab for problem based charting.  Patient discussed with Dr. Daryll Drown

## 2022-11-26 NOTE — Assessment & Plan Note (Signed)
Patient with left knee pain that is chronic. He had recent knee films showing moderate patellofemoral and severe medial compartment joint space narrowing along with subchondral sclerosis. Films appear consistent with OA. He states that ED provider had placed referral to orthopedic surgery at that time but he refused. However, he has been needing to use crutches at home and, after reconsidering, has opted to pursue orthopedic surgery evaluation to determine his options. His pain is relatively well managed but still notes inability to walk without assistive devices (crutches).   On exam, he does have mild edema in his left knee along with mild TTP. No overt effusions, crepitus, or overlying erythema/warmth noted. He does have limited extension and flexion of L knee.   He has been using voltaren gel and prn ibuprofen for pain control. Will continue this. Will also provide referral to orthopedic surgery for further evaluation.  Plan: -voltaren gel, ibuprofen q6h prn -referral to orthopedic surgery

## 2022-12-04 NOTE — Progress Notes (Signed)
Internal Medicine Clinic Attending  Case discussed with Dr. Jinwala at the time of the visit.  We reviewed the resident's history and exam and pertinent patient test results.  I agree with the assessment, diagnosis, and plan of care documented in the resident's note.  

## 2022-12-05 ENCOUNTER — Encounter: Payer: 59 | Admitting: Student

## 2022-12-09 DIAGNOSIS — M1712 Unilateral primary osteoarthritis, left knee: Secondary | ICD-10-CM | POA: Diagnosis not present

## 2022-12-09 DIAGNOSIS — M5136 Other intervertebral disc degeneration, lumbar region: Secondary | ICD-10-CM | POA: Diagnosis not present

## 2022-12-22 ENCOUNTER — Telehealth: Payer: Self-pay

## 2022-12-22 NOTE — Transitions of Care (Post Inpatient/ED Visit) (Unsigned)
   12/22/2022  Name: Mitchell Johnson MRN: [id number] DOB: 07-May-1966  Today's TOC FU Call Status: Today's TOC FU Call Status:: Unsuccessul Call (1st Attempt) Unsuccessful Call (1st Attempt) Date: 12/22/22  Attempted to reach the patient regarding the most recent Inpatient/ED visit.  Follow Up Plan: Additional outreach attempts will be made to reach the patient to complete the Transitions of Care (Post Inpatient/ED visit) call.   Signature Karena Addison, LPN [facility] Nurse Health Advisor Direct Dial [phone]

## 2022-12-23 NOTE — Transitions of Care (Post Inpatient/ED Visit) (Signed)
   12/23/2022  Name: Mitchell Johnson MRN: [id number] DOB: [birth year]  Today's TOC FU Call Status: Today's TOC FU Call Status:: Unsuccessful Call (2nd Attempt) Unsuccessful Call (1st Attempt) Date: 12/22/22 Unsuccessful Call (2nd Attempt) Date: 12/23/22  Attempted to reach the patient regarding the most recent Inpatient/ED visit.  Follow Up Plan: Additional outreach attempts will be made to reach the patient to complete the Transitions of Care (Post Inpatient/ED visit) call.   Signature Karena Addison, LPN [facility] Nurse Health Advisor Direct Dial [phone]

## 2022-12-25 ENCOUNTER — Encounter: Payer: Self-pay | Admitting: Student

## 2022-12-25 NOTE — Transitions of Care (Post Inpatient/ED Visit) (Signed)
   12/25/2022  Name: Mitchell Johnson MRN: [id number] DOB: 1965/09/09  Today's TOC FU Call Status: Today's TOC FU Call Status:: Unsuccessful Call (3rd Attempt) Unsuccessful Call (1st Attempt) Date: 12/22/22 Unsuccessful Call (2nd Attempt) Date: 12/23/22 Unsuccessful Call (3rd Attempt) Date: 12/25/22  Attempted to reach the patient regarding the most recent Inpatient/ED visit.  Follow Up Plan: No further outreach attempts will be made at this time. We have been unable to contact the patient.  Signature Karena Addison, LPN [facility] Nurse Health Advisor Direct Dial [phone]

## 2023-01-19 ENCOUNTER — Telehealth: Payer: Self-pay | Admitting: *Deleted

## 2023-01-19 NOTE — Telephone Encounter (Signed)
Spoke with patient regarding his PCS request / per patient he is back to work- patient is not in need of PCS at this time.

## 2023-02-17 ENCOUNTER — Ambulatory Visit: Payer: Commercial Managed Care - HMO | Admitting: Vascular Surgery

## 2023-04-01 DIAGNOSIS — M170 Bilateral primary osteoarthritis of knee: Secondary | ICD-10-CM | POA: Diagnosis not present

## 2023-12-30 ENCOUNTER — Emergency Department: Payer: Self-pay

## 2023-12-30 ENCOUNTER — Other Ambulatory Visit: Payer: Self-pay

## 2023-12-30 ENCOUNTER — Emergency Department
Admission: EM | Admit: 2023-12-30 | Discharge: 2023-12-30 | Disposition: A | Discharge status: Home / Self Care | Payer: Self-pay | Attending: Emergency Medicine | Admitting: Emergency Medicine

## 2023-12-30 DIAGNOSIS — S0101XA Laceration without foreign body of scalp, initial encounter: Secondary | ICD-10-CM | POA: Diagnosis not present

## 2023-12-30 DIAGNOSIS — M542 Cervicalgia: Secondary | ICD-10-CM | POA: Diagnosis not present

## 2023-12-30 DIAGNOSIS — Z23 Encounter for immunization: Secondary | ICD-10-CM | POA: Insufficient documentation

## 2023-12-30 DIAGNOSIS — Y92410 Unspecified street and highway as the place of occurrence of the external cause: Secondary | ICD-10-CM | POA: Diagnosis not present

## 2023-12-30 DIAGNOSIS — M25562 Pain in left knee: Secondary | ICD-10-CM | POA: Insufficient documentation

## 2023-12-30 DIAGNOSIS — R0789 Other chest pain: Secondary | ICD-10-CM | POA: Diagnosis not present

## 2023-12-30 DIAGNOSIS — M7918 Myalgia, other site: Secondary | ICD-10-CM

## 2023-12-30 DIAGNOSIS — S0003XA Contusion of scalp, initial encounter: Secondary | ICD-10-CM

## 2023-12-30 DIAGNOSIS — M79651 Pain in right thigh: Secondary | ICD-10-CM | POA: Diagnosis not present

## 2023-12-30 DIAGNOSIS — S0990XA Unspecified injury of head, initial encounter: Secondary | ICD-10-CM | POA: Diagnosis present

## 2023-12-30 MED ORDER — OXYCODONE HCL 5 MG PO TABS
5.0000 mg | ORAL_TABLET | Freq: Once | ORAL | Status: AC
  Administered 2023-12-30: 5 mg via ORAL
  Filled 2023-12-30: qty 1

## 2023-12-30 MED ORDER — LIDOCAINE 5 % EX PTCH
1.0000 | MEDICATED_PATCH | CUTANEOUS | Status: DC
  Administered 2023-12-30: 1 via TRANSDERMAL
  Filled 2023-12-30: qty 1

## 2023-12-30 MED ORDER — ACETAMINOPHEN 500 MG PO TABS
1000.0000 mg | ORAL_TABLET | Freq: Once | ORAL | Status: AC
  Administered 2023-12-30: 1000 mg via ORAL
  Filled 2023-12-30: qty 2

## 2023-12-30 MED ORDER — LIDOCAINE 5 % EX PTCH: 1.0000 | MEDICATED_PATCH | CUTANEOUS | 0 refills | Status: AC

## 2023-12-30 MED ORDER — OXYCODONE HCL 5 MG PO CAPS: 5.0000 mg | ORAL_CAPSULE | Freq: Four times a day (QID) | ORAL | 0 refills | Status: DC | PRN

## 2023-12-30 MED ORDER — TETANUS-DIPHTH-ACELL PERTUSSIS 5-2.5-18.5 LF-MCG/0.5 IM SUSY
0.5000 mL | PREFILLED_SYRINGE | Freq: Once | INTRAMUSCULAR | Status: AC
  Administered 2023-12-30: 0.5 mL via INTRAMUSCULAR
  Filled 2023-12-30: qty 0.5

## 2023-12-30 MED ORDER — OXYCODONE HCL 5 MG PO CAPS: 5.0000 mg | ORAL_CAPSULE | Freq: Four times a day (QID) | ORAL | 0 refills | Status: AC | PRN

## 2023-12-30 MED ORDER — IBUPROFEN 600 MG PO TABS
600.0000 mg | ORAL_TABLET | Freq: Once | ORAL | Status: AC
  Administered 2023-12-30: 600 mg via ORAL
  Filled 2023-12-30: qty 1
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
## 2023-12-30 NOTE — ED Notes (Signed)
 Patient verbalizes understanding of discharge instructions. Opportunity for questioning and answers were provided. Armband removed by staff, pt discharged from ED. Wheeled out to lobby by daughter
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
## 2023-12-30 NOTE — Discharge Instructions (Addendum)
 Please see your primary care doctor or go to an urgent care or emergency department to get your staples removed in 5 to 7 days.  You can take Tylenol  or ibuprofen  every 6 hours as needed for pain.  Please reserve the oxycodone  for severe breakthrough pain.  Do not drive or operate heavy machinery when you are on the oxycodone .
